# Patient Record
Sex: Male | Born: 1965 | Race: White | Hispanic: No | Marital: Married | Smoking: Former smoker
Health system: Southern US, Community
[De-identification: ages and names within clinical notes are randomized; demographics above are authoritative.]

## PROBLEM LIST (undated history)

## (undated) DIAGNOSIS — I10 Essential (primary) hypertension: Secondary | ICD-10-CM

## (undated) DIAGNOSIS — K222 Esophageal obstruction: Secondary | ICD-10-CM

## (undated) DIAGNOSIS — C140 Malignant neoplasm of pharynx, unspecified: Secondary | ICD-10-CM

## (undated) DIAGNOSIS — K219 Gastro-esophageal reflux disease without esophagitis: Secondary | ICD-10-CM

## (undated) DIAGNOSIS — E039 Hypothyroidism, unspecified: Secondary | ICD-10-CM

## (undated) HISTORY — DX: Hypothyroidism, unspecified: E03.9

## (undated) HISTORY — DX: Gastro-esophageal reflux disease without esophagitis: K21.9

## (undated) HISTORY — PX: TONSILLECTOMY: SUR1361

## (undated) HISTORY — DX: Esophageal obstruction: K22.2

## (undated) HISTORY — DX: Essential (primary) hypertension: I10

## (undated) HISTORY — DX: Malignant neoplasm of pharynx, unspecified: C14.0

---

## 2001-02-20 ENCOUNTER — Encounter: Admission: RE | Admit: 2001-02-20 | Discharge: 2001-02-20 | Payer: Self-pay | Admitting: Family Medicine

## 2001-02-20 ENCOUNTER — Encounter: Payer: Self-pay | Admitting: Family Medicine

## 2008-10-05 ENCOUNTER — Encounter: Admission: RE | Admit: 2008-10-05 | Discharge: 2008-10-05 | Payer: Self-pay | Admitting: Family Medicine

## 2010-10-17 ENCOUNTER — Encounter: Payer: Self-pay | Admitting: Cardiology

## 2010-10-18 ENCOUNTER — Ambulatory Visit (INDEPENDENT_AMBULATORY_CARE_PROVIDER_SITE_OTHER): Payer: 59 | Admitting: Cardiology

## 2010-10-18 ENCOUNTER — Encounter: Payer: Self-pay | Admitting: Cardiology

## 2010-10-18 ENCOUNTER — Encounter (INDEPENDENT_AMBULATORY_CARE_PROVIDER_SITE_OTHER): Payer: Self-pay | Admitting: *Deleted

## 2010-10-18 DIAGNOSIS — R072 Precordial pain: Secondary | ICD-10-CM | POA: Insufficient documentation

## 2010-10-18 DIAGNOSIS — I10 Essential (primary) hypertension: Secondary | ICD-10-CM | POA: Insufficient documentation

## 2010-10-18 DIAGNOSIS — E039 Hypothyroidism, unspecified: Secondary | ICD-10-CM | POA: Insufficient documentation

## 2010-10-18 DIAGNOSIS — R0989 Other specified symptoms and signs involving the circulatory and respiratory systems: Secondary | ICD-10-CM | POA: Insufficient documentation

## 2010-10-18 DIAGNOSIS — R209 Unspecified disturbances of skin sensation: Secondary | ICD-10-CM | POA: Insufficient documentation

## 2010-10-18 DIAGNOSIS — M25529 Pain in unspecified elbow: Secondary | ICD-10-CM | POA: Insufficient documentation

## 2010-10-25 NOTE — Miscellaneous (Signed)
  Clinical Lists Changes  Orders: Added new Referral order of Stress Echo (Stress Echo) - Signed

## 2010-10-25 NOTE — Assessment & Plan Note (Signed)
Summary: consult: left arm pain. per luz [phone].pt has uhc...   CC:  pt states he has been having numbness in arm he states this occurs when his heart rate goes up...he states he has had cancer in the past in his throat...pt complains of dizziness.  History of Present Illness: 45 year old male with no prior cardiac history for evaluation of chest pain. Patient typically does not have dyspnea on exertion, orthopnea, PND, pedal edema, palpitations, exertional chest pain or syncope. He recently has noticed a numb feeling in his left face, shoulder and left upper extremity when his heart rate is elevated. This can occur with activities. It does not occur with using his left upper extremity. There is no associated shortness of breath, diaphoresis or nausea. It improves as his heart rate decreases. He also has had transient dizzy spells for one to 2 seconds. There has been no frank syncope.  Current Medications (verified): 1)  Cozaar 50 Mg Tabs (Losartan Potassium) .... Take One Tablet By Mouth Daily 2)  Synthroid 100 Mcg Tabs (Levothyroxine Sodium) .Marland Kitchen.. 1 Tab By Mouth Once Daily 3)  Prilosec 20 Mg Cpdr (Omeprazole) .Marland Kitchen.. 1 Tab By Mouth Once Daily  Past History:  Past Medical History: HYPOTHYROIDISM  HYPERTENSION H/O throat cancer (s/p XRT and chemotherapy)  Past Surgical History: Previous lung trauma  Tonsillectomy  Family History: Reviewed history from 10/18/2010 and no changes required. No premature CAD in immediate family  GM: breast cancer  Social History: Tobacco Use - Former Full Time Married  Alcohol Use - yes  Review of Systems       no fevers or chills, productive cough, hemoptysis, dysphasia, odynophagia, melena, hematochezia, dysuria, hematuria, rash, seizure activity, orthopnea, PND, pedal edema, claudication. Remaining systems are negative.   Vital Signs:  Patient profile:   45 year old male Height:      72 inches Weight:      208 pounds BMI:      28.31 Pulse rate:   97 / minute Resp:     14 per minute BP sitting:   97 / 70  (left arm)  Vitals Entered By: Kem Parkinson (October 18, 2010 3:11 PM)  Physical Exam  General:  Well developed/well nourished in NAD Skin warm/dry Patient not depressed No peripheral clubbing Back-normal HEENT-normal/normal eyelids Neck supple/normal carotid upstroke bilaterally; no bruits; no JVD; no thyromegaly; Skin consistent with previous radiation. chest - CTA/ normal expansion CV - RRR/normal S1 and S2; no murmurs, rubs or gallops;  PMI nondisplaced Abdomen -NT/ND, no HSM, no mass, + bowel sounds, no bruit 2+ femoral pulses, no bruits Ext-no edema, chords, 2+ DP Neuro-grossly nonfocal      EKG  Procedure date:  10/17/2010  Findings:      Sinus rhythm at a rate of 87. Axis normal. No ST changes.  Impression & Recommendations:  Problem # 1:  CHEST PAIN, PRECORDIAL (ICD-786.51) Pt describes a discomfort/none consultation in his left arm and shoulder that is atypical. We'll schedule stress echocardiogram to further evaluate. I will also schedule Dopplers of his upper extremity to exclude stenosis. Orders: Echocardiogram (Echo)  Problem # 2:  HYPOTHYROIDISM (ICD-244.9)  His updated medication list for this problem includes:    Synthroid 100 Mcg Tabs (Levothyroxine sodium) .Marland Kitchen... 1 tab by mouth once daily  Problem # 3:  HYPERTENSION (ICD-401.9) Blood pressure controlled. Continue present medications. His updated medication list for this problem includes:    Cozaar 50 Mg Tabs (Losartan potassium) .Marland Kitchen... Take one tablet by mouth daily  Other Orders: Carotid Duplex (Carotid Duplex) Arterial Duplex Upper Extremity (Arterial Duplex Up )  Patient Instructions: 1)  Your physician has requested that you have a carotid duplex. This test is an ultrasound of the carotid arteries in your neck. It looks at blood flow through these arteries that supply the brain with blood. Allow one hour for  this exam. There are no restrictions or special instructions. 2)  Your physician has requested that you have a upper extremity arterial duplex.  This test is an ultrasound of the arteries in the legs or arms.  It looks at arterial blood flow in the legs and arms.  Allow one hour for Lower and Upper Arterial scans. There are no restrictions or special instructions. 3)  Your physician has requested that you have a stress echocardiogram. For further information please visit [email or web address]  Please follow instruction sheet as given.

## 2010-11-02 ENCOUNTER — Other Ambulatory Visit: Payer: 59

## 2010-11-02 ENCOUNTER — Other Ambulatory Visit (HOSPITAL_COMMUNITY): Payer: 59

## 2010-11-08 ENCOUNTER — Telehealth (INDEPENDENT_AMBULATORY_CARE_PROVIDER_SITE_OTHER): Payer: Self-pay | Admitting: *Deleted

## 2010-11-12 ENCOUNTER — Encounter: Payer: Self-pay | Admitting: Cardiology

## 2010-11-12 ENCOUNTER — Ambulatory Visit (HOSPITAL_COMMUNITY): Payer: BC Managed Care – PPO | Attending: Cardiology

## 2010-11-12 ENCOUNTER — Other Ambulatory Visit (HOSPITAL_COMMUNITY): Payer: 59

## 2010-11-12 ENCOUNTER — Encounter (INDEPENDENT_AMBULATORY_CARE_PROVIDER_SITE_OTHER): Payer: 59

## 2010-11-12 ENCOUNTER — Other Ambulatory Visit: Payer: 59

## 2010-11-12 DIAGNOSIS — R0989 Other specified symptoms and signs involving the circulatory and respiratory systems: Secondary | ICD-10-CM

## 2010-11-12 DIAGNOSIS — R55 Syncope and collapse: Secondary | ICD-10-CM

## 2010-11-12 DIAGNOSIS — I6529 Occlusion and stenosis of unspecified carotid artery: Secondary | ICD-10-CM

## 2010-11-12 DIAGNOSIS — E039 Hypothyroidism, unspecified: Secondary | ICD-10-CM | POA: Insufficient documentation

## 2010-11-12 DIAGNOSIS — I10 Essential (primary) hypertension: Secondary | ICD-10-CM | POA: Insufficient documentation

## 2010-11-12 DIAGNOSIS — R079 Chest pain, unspecified: Secondary | ICD-10-CM | POA: Insufficient documentation

## 2010-11-12 DIAGNOSIS — R072 Precordial pain: Secondary | ICD-10-CM

## 2010-11-14 ENCOUNTER — Other Ambulatory Visit: Payer: 59

## 2010-11-15 ENCOUNTER — Telehealth: Payer: Self-pay | Admitting: Cardiology

## 2010-11-15 NOTE — Progress Notes (Signed)
Summary: Stress echo appt  Phone Note Outgoing Call Call back at [facility] Phone [phone]   Call placed by: Stanton Kidney, EMT-P,  November 08, 2010 2:06 PM Summary of Call: Unable to leave message reference appts. on 11/12/10. Stanton Kidney, EMT-P  November 08, 2010 2:07 PM

## 2010-11-20 NOTE — Progress Notes (Signed)
Summary: pt calling re results of stress test-   Phone Note Call from Patient   Caller: Patient [phone] Reason for Call: Talk to Nurse, Lab or Test Results Summary of Call: pt calling re results of stress test  Initial call taken by: Glynda Jaeger,  November 15, 2010 10:29 AM  Follow-up for Phone Call        pt aware* Whitney Maeola Sarah RN  November 15, 2010 11:30 AM  Follow-up by: Whitney Maeola Sarah RN,  November 15, 2010 11:30 AM

## 2010-11-20 NOTE — Letter (Signed)
Summary: [facility] Referral   Imported By: Kassie Mends 11/13/2010 11:49:02  _____________________________________________________________________  External Attachment:    Type:   Image     Comment:   External Document

## 2010-11-21 ENCOUNTER — Emergency Department (HOSPITAL_COMMUNITY)
Admission: EM | Admit: 2010-11-21 | Discharge: 2010-11-21 | Disposition: A | Discharge status: Home / Self Care | Payer: BC Managed Care – PPO | Attending: Emergency Medicine | Admitting: Emergency Medicine

## 2010-11-21 DIAGNOSIS — E039 Hypothyroidism, unspecified: Secondary | ICD-10-CM | POA: Insufficient documentation

## 2010-11-21 DIAGNOSIS — M542 Cervicalgia: Secondary | ICD-10-CM | POA: Insufficient documentation

## 2010-11-21 DIAGNOSIS — I10 Essential (primary) hypertension: Secondary | ICD-10-CM | POA: Insufficient documentation

## 2012-06-19 ENCOUNTER — Other Ambulatory Visit: Payer: Self-pay | Admitting: Family Medicine

## 2012-06-19 DIAGNOSIS — M542 Cervicalgia: Secondary | ICD-10-CM

## 2012-06-30 ENCOUNTER — Ambulatory Visit
Admission: RE | Admit: 2012-06-30 | Discharge: 2012-06-30 | Disposition: A | Discharge status: Home / Self Care | Payer: 59 | Source: Ambulatory Visit | Attending: Family Medicine | Admitting: Family Medicine

## 2012-06-30 DIAGNOSIS — M542 Cervicalgia: Secondary | ICD-10-CM

## 2012-12-10 ENCOUNTER — Other Ambulatory Visit: Payer: Self-pay | Admitting: Family Medicine

## 2012-12-10 DIAGNOSIS — R202 Paresthesia of skin: Secondary | ICD-10-CM

## 2012-12-16 ENCOUNTER — Ambulatory Visit
Admission: RE | Admit: 2012-12-16 | Discharge: 2012-12-16 | Disposition: A | Discharge status: Home / Self Care | Payer: 59 | Source: Ambulatory Visit | Attending: Family Medicine | Admitting: Family Medicine

## 2012-12-16 ENCOUNTER — Encounter: Payer: Self-pay | Admitting: Family Medicine

## 2012-12-16 ENCOUNTER — Encounter (INDEPENDENT_AMBULATORY_CARE_PROVIDER_SITE_OTHER): Payer: 59 | Admitting: *Deleted

## 2012-12-16 DIAGNOSIS — R202 Paresthesia of skin: Secondary | ICD-10-CM

## 2012-12-16 DIAGNOSIS — R209 Unspecified disturbances of skin sensation: Secondary | ICD-10-CM

## 2013-05-14 ENCOUNTER — Encounter: Payer: Self-pay | Admitting: Neurology

## 2013-05-17 ENCOUNTER — Ambulatory Visit: Payer: 59 | Admitting: Neurology

## 2014-03-08 ENCOUNTER — Ambulatory Visit: Payer: 59 | Admitting: Cardiovascular Disease

## 2014-03-22 ENCOUNTER — Encounter: Payer: Self-pay | Admitting: *Deleted

## 2014-03-22 ENCOUNTER — Encounter: Payer: Self-pay | Admitting: Cardiology

## 2014-03-22 ENCOUNTER — Ambulatory Visit (INDEPENDENT_AMBULATORY_CARE_PROVIDER_SITE_OTHER): Payer: BC Managed Care – HMO | Admitting: Cardiology

## 2014-03-22 VITALS — BP 108/78 | HR 83 | Ht 72.0 in | Wt 200.0 lb

## 2014-03-22 DIAGNOSIS — R209 Unspecified disturbances of skin sensation: Secondary | ICD-10-CM

## 2014-03-22 DIAGNOSIS — R200 Anesthesia of skin: Secondary | ICD-10-CM

## 2014-03-22 DIAGNOSIS — R011 Cardiac murmur, unspecified: Secondary | ICD-10-CM | POA: Insufficient documentation

## 2014-03-22 NOTE — Progress Notes (Signed)
     HPI: 48 yo male for evaluation of murmur and LUE numbness. Seen for evaluation of chest pain 3/12. Carotid dopplers 3/12 showed 1-39% bilateral stenosis. Stress echo 3/12 normal. Upper ext arterial dopplers 4/14 showed normal perfusion. Patient denies dyspnea on exertion, orthopnea, PND, pedal edema, chest pain or syncope. For 3 years he has had numbness in his left upper extremity. This occurs when his heart rate is elevated with vigorous activities. He also notices this with more vigorous activities using his left upper extremity. More recently he can reproduce the symptoms with applying pressure to the left side of his neck. He states his arm feels dead and numb. Symptoms resolve with decreased heart rate. He has seen neurosurgery for evaluation with no significant abnormalities noted by his report.  Current Outpatient Prescriptions  Medication Sig Dispense Refill  . levothyroxine (SYNTHROID) 150 MCG tablet Take 150 mcg by mouth daily before breakfast.      . losartan (COZAAR) 50 MG tablet Take 1 tablet by mouth daily.      Marland Kitchen omeprazole (PRILOSEC) 20 MG capsule Take 20 mg by mouth daily.       No current facility-administered medications for this visit.    No Known Allergies  Past Medical History  Diagnosis Date  . Hypothyroidism   . GERD (gastroesophageal reflux disease)   . Hypertension   . Throat cancer     Radiation  . Esophageal stricture     Past Surgical History  Procedure Laterality Date  . Tonsillectomy      History   Social History  . Marital Status: Married    Spouse Name: N/A    Number of Children: 4  . Years of Education: N/A   Occupational History  .      Architectural technologist   Social History Main Topics  . Smoking status: Never Smoker   . Smokeless tobacco: Not on file  . Alcohol Use: Yes     Comment: Rare  . Drug Use: Not on file  . Sexual Activity: Not on file   Other Topics Concern  . Not on file   Social History Narrative  . No  narrative on file    Family History  Problem Relation Age of Onset  . Heart disease Maternal Grandfather   . Heart disease Maternal Grandmother     ROS: no fevers or chills, productive cough, hemoptysis, dysphasia, odynophagia, melena, hematochezia, dysuria, hematuria, rash, seizure activity, orthopnea, PND, pedal edema, claudication. Remaining systems are negative.  Physical Exam:   Blood pressure 108/78, pulse 83, height 6' (1.829 m), weight 200 lb (90.719 kg).  Blood pressure left upper extremity 138/86. Right upper extremity 140/86.  General:  Well developed/well nourished in NAD Skin warm/dry Patient not depressed No peripheral clubbing Back-normal HEENT-normal/normal eyelids Neck supple/normal carotid upstroke bilaterally; no bruits; no JVD; no thyromegaly chest - CTA/ normal expansion CV - RRR/normal S1 and S2; no rubs or gallops;  PMI nondisplaced; 2/6 systolic murmur lower left sternal border. Abdomen -NT/ND, no HSM, no mass, + bowel sounds, no bruit 2+ femoral pulses, no bruits Ext-no edema, chords, 2+ DP Neuro-grossly nonfocal  ECG Sinus rhythm at a rate of 83. No ST changes.

## 2014-03-22 NOTE — Assessment & Plan Note (Signed)
Etiology unclear. Apparently previous neurosurgical evaluation unremarkable. Previous arterial Dopplers negative. Previous stress echocardiogram unremarkable. Blood pressure equal in both arms. He did apply pressure to his neck during the office visit which reproduces his symptoms. The left radial pulse at that time was normal. Etiology of symptoms not clear to me. Question vascular insufficiency. This seems less likely but we'll proceed with CTA of aortic arch and left subclavian to further assess. Note his symptoms occur with vigorous use of his left upper extremity or when his heart rate is elevated.

## 2014-03-22 NOTE — Patient Instructions (Signed)
Your physician recommends that you schedule a follow-up appointment in: [place] physician has requested that you have an echocardiogram. Echocardiography is a painless test that uses sound waves to create images of your heart. It provides your doctor with information about the size and shape of your heart and how well your heart's chambers and valves are working. This procedure takes approximately one hour. There are no restrictions for this procedure.   CTA OF THE CHEST WITH AND W/O CONTRAST TO LOOK AT AORTIC ARCH DX NUMBNESS AT [place]  CTA OF THE LEFT UPPER EXT AND SUBCLAVIN FOR NUMBNESS AT [place]

## 2014-03-22 NOTE — Assessment & Plan Note (Signed)
Blood pressure controlled. Continue present medications. 

## 2014-03-22 NOTE — Assessment & Plan Note (Signed)
Schedule echocardiogram to further assess. 

## 2014-03-28 ENCOUNTER — Ambulatory Visit (HOSPITAL_COMMUNITY): Payer: BC Managed Care – PPO

## 2014-03-28 ENCOUNTER — Ambulatory Visit (HOSPITAL_COMMUNITY): Admission: RE | Admit: 2014-03-28 | Payer: BC Managed Care – PPO | Source: Ambulatory Visit

## 2014-03-30 ENCOUNTER — Ambulatory Visit (HOSPITAL_COMMUNITY): Payer: BC Managed Care – HMO

## 2014-05-20 ENCOUNTER — Ambulatory Visit (HOSPITAL_COMMUNITY)
Admission: RE | Admit: 2014-05-20 | Discharge: 2014-05-20 | Disposition: A | Discharge status: Home / Self Care | Payer: BC Managed Care – PPO | Source: Ambulatory Visit | Attending: Cardiology | Admitting: Cardiology

## 2014-05-20 DIAGNOSIS — R209 Unspecified disturbances of skin sensation: Secondary | ICD-10-CM | POA: Diagnosis not present

## 2014-05-20 DIAGNOSIS — R200 Anesthesia of skin: Secondary | ICD-10-CM

## 2014-05-20 MED ORDER — IOHEXOL 350 MG/ML SOLN
100.0000 mL | Freq: Once | INTRAVENOUS | Status: AC | PRN
  Administered 2014-05-20: 100 mL via INTRAVENOUS

## 2014-05-24 ENCOUNTER — Other Ambulatory Visit (HOSPITAL_COMMUNITY): Payer: Self-pay | Admitting: Cardiology

## 2014-05-24 DIAGNOSIS — R011 Cardiac murmur, unspecified: Secondary | ICD-10-CM

## 2014-05-30 ENCOUNTER — Ambulatory Visit (HOSPITAL_COMMUNITY)
Admission: RE | Admit: 2014-05-30 | Discharge: 2014-05-30 | Disposition: A | Discharge status: Home / Self Care | Payer: BC Managed Care – PPO | Source: Ambulatory Visit | Attending: Cardiology | Admitting: Cardiology

## 2014-05-30 DIAGNOSIS — R011 Cardiac murmur, unspecified: Secondary | ICD-10-CM | POA: Insufficient documentation

## 2014-05-30 DIAGNOSIS — I10 Essential (primary) hypertension: Secondary | ICD-10-CM | POA: Insufficient documentation

## 2014-05-30 DIAGNOSIS — R209 Unspecified disturbances of skin sensation: Secondary | ICD-10-CM | POA: Insufficient documentation

## 2014-05-30 DIAGNOSIS — E039 Hypothyroidism, unspecified: Secondary | ICD-10-CM | POA: Diagnosis not present

## 2014-05-30 NOTE — Progress Notes (Signed)
2D Echo Performed 05/30/2014    Tammie Crouch, RCS  

## 2014-06-06 ENCOUNTER — Encounter: Payer: Self-pay | Admitting: Cardiology

## 2014-06-09 ENCOUNTER — Encounter: Payer: Self-pay | Admitting: *Deleted

## 2014-06-14 ENCOUNTER — Telehealth: Payer: Self-pay | Admitting: Cardiology

## 2014-06-14 NOTE — Telephone Encounter (Signed)
Pt would like ultrasound results from about 2 weeks ago please.

## 2014-06-14 NOTE — Telephone Encounter (Signed)
Attempted to contact - no answer - VM box full

## 2014-06-14 NOTE — Telephone Encounter (Signed)
Spoke with patient and provided echo results. Informed him that echo results were mailed to him. Patient states his left arm is still going numb and echo and CT of upper extremity were OK.   Order upper arterial doppler study?   Patient would like to know what is next best step

## 2014-06-15 NOTE — Telephone Encounter (Signed)
CTA does not show significant left subclavian disease; if he wants, could see Dr Fletcher Anon to see if anything else would be helpful. Troy Hull

## 2014-06-15 NOTE — Telephone Encounter (Signed)
Spoke with pt, Aware of dr crenshaw's recommendations.  Follow up scheduled  

## 2014-07-26 ENCOUNTER — Encounter: Payer: BC Managed Care – PPO | Admitting: Cardiovascular Disease

## 2014-08-01 NOTE — Progress Notes (Signed)
This encounter was created in error - please disregard.

## 2015-11-09 ENCOUNTER — Other Ambulatory Visit: Payer: Self-pay | Admitting: Orthopaedic Surgery

## 2015-11-09 DIAGNOSIS — M25512 Pain in left shoulder: Secondary | ICD-10-CM

## 2015-11-21 ENCOUNTER — Ambulatory Visit
Admission: RE | Admit: 2015-11-21 | Discharge: 2015-11-21 | Disposition: A | Discharge status: Home / Self Care | Payer: 59 | Source: Ambulatory Visit | Attending: Orthopaedic Surgery | Admitting: Orthopaedic Surgery

## 2015-11-21 DIAGNOSIS — M25512 Pain in left shoulder: Secondary | ICD-10-CM

## 2015-11-21 MED ORDER — GADOBENATE DIMEGLUMINE 529 MG/ML IV SOLN
19.0000 mL | Freq: Once | INTRAVENOUS | Status: AC | PRN
  Administered 2015-11-21: 19 mL via INTRAVENOUS

## 2016-12-27 IMAGING — MR MR SHOULDER*L* WO/W CM
4 of 8 series · 17 of 40 positions shown · IV contrast (multihance)
Comparison: None.

CLINICAL DATA: Left scapular and shoulder pain. Limited range of
motion for 1 year.

EXAM:
MRI OF THE LEFT SHOULDER WITHOUT AND WITH CONTRAST
TECHNIQUE: Multiplanar, multisequence MR imaging of the left shoulder was
performed before and after the administration of intravenous
contrast.
CONTRAST:  19mL MULTIHANCE GADOBENATE DIMEGLUMINE 529 MG/ML IV SOLN

[Series 6: T2 fat-sat · oblique · left · 3.0mm · 0.44mm/px · 4 of 21 slices shown (1 of 3)]
[im 1/21]
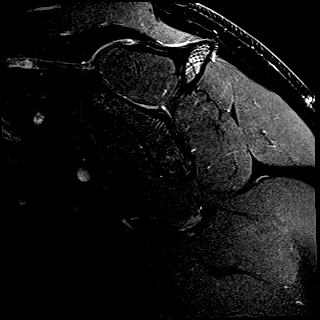
[im 7/21]
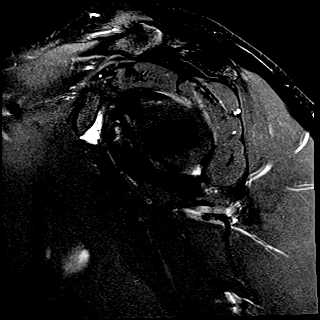
[im 14/21]
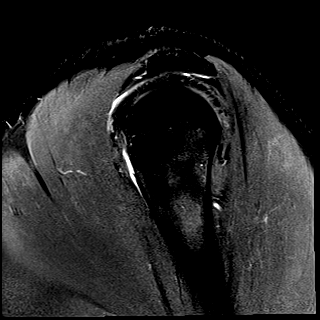
[im 21/21]
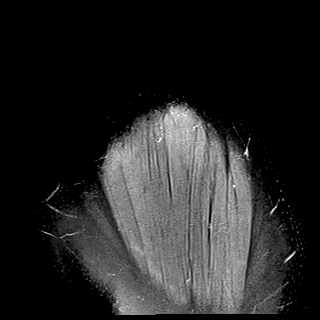

[Series 8: T2 fat-sat · axial · left · 3.0mm · 0.44mm/px · z∈[-46,+42]mm · 5 of 25 slices shown (2 of 3)]
[im 1/25]
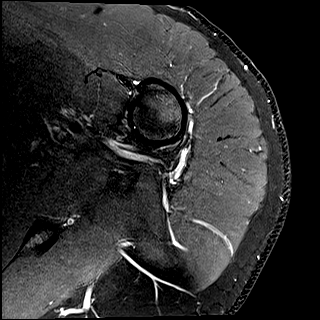
[im 7/25]
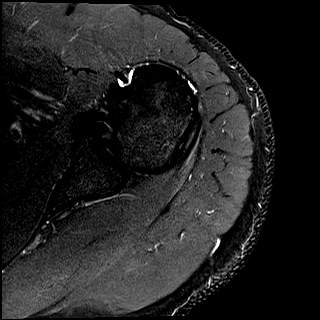
[im 13/25]
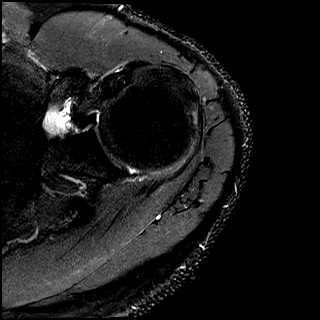
[im 19/25]
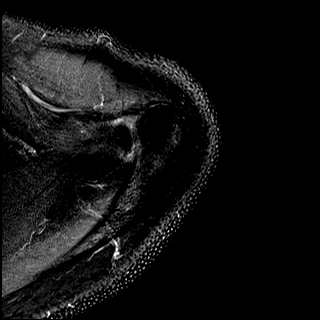
[im 25/25]
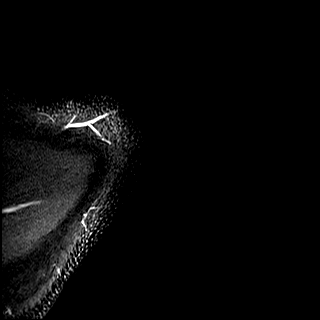

[Series 9: PD · oblique · left · 3.0mm · 0.18mm/px · 5 of 21 slices shown]
[im 1/21]
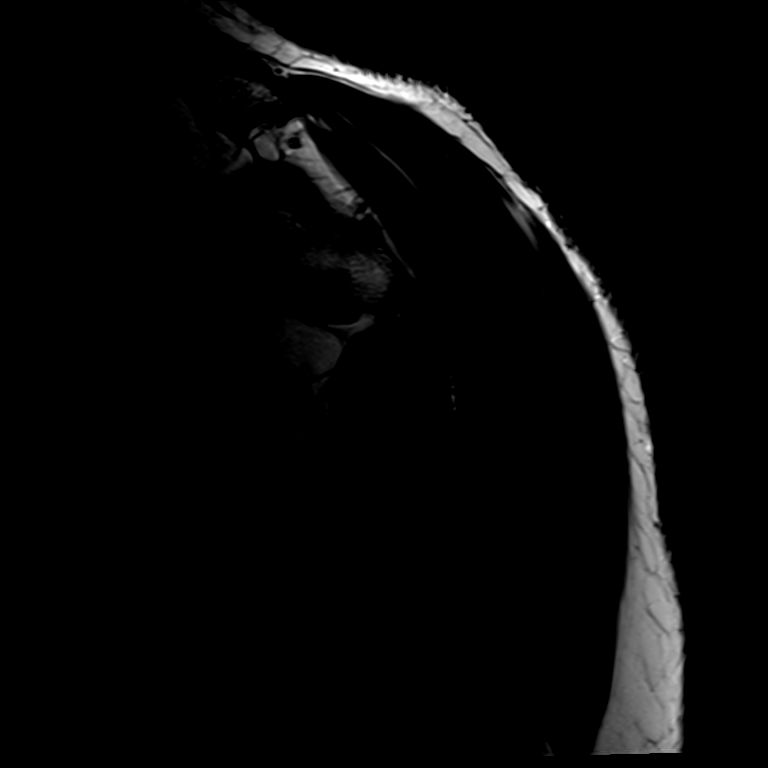
[im 6/21]
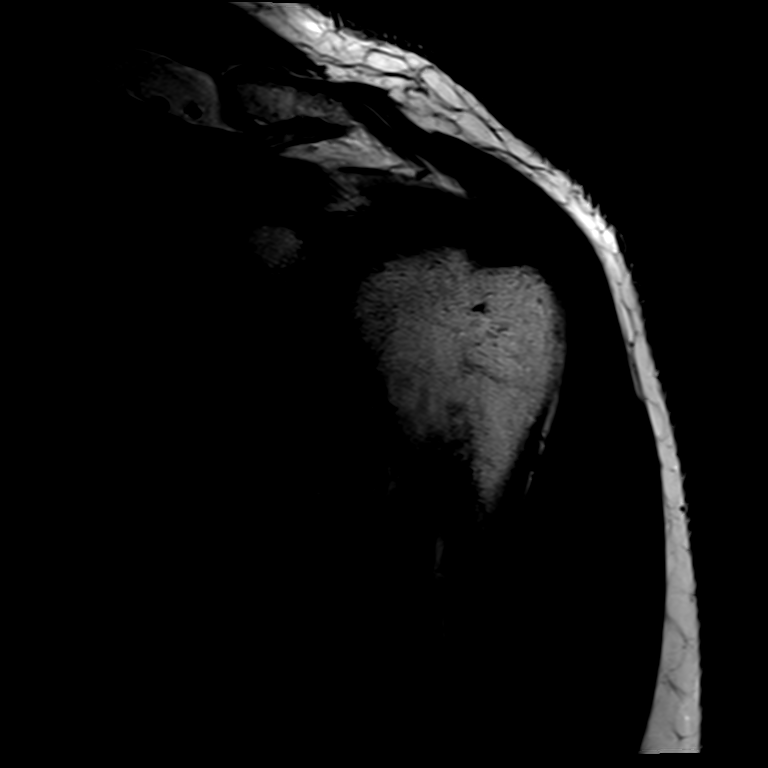
[im 11/21]
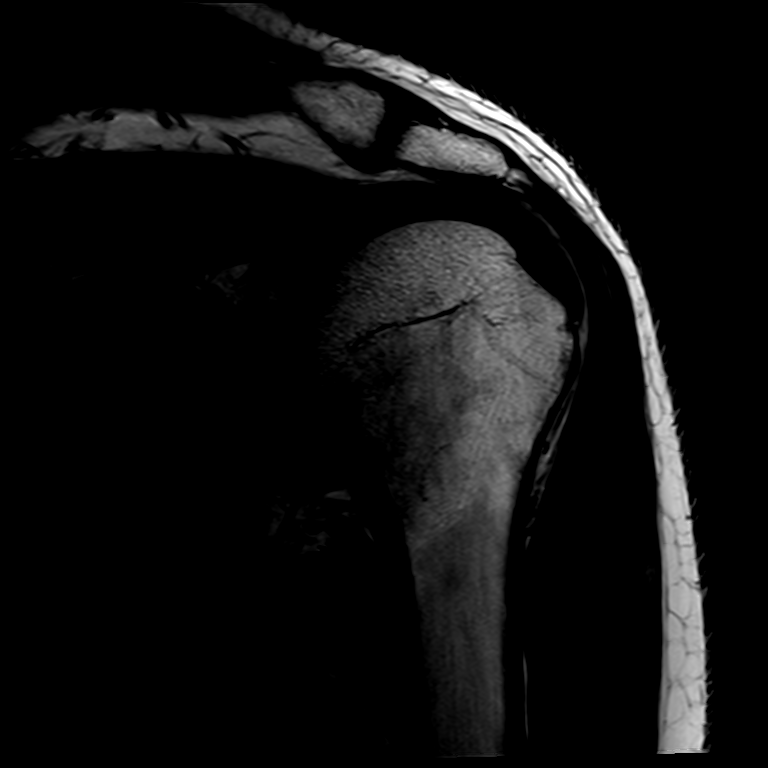
[im 16/21]
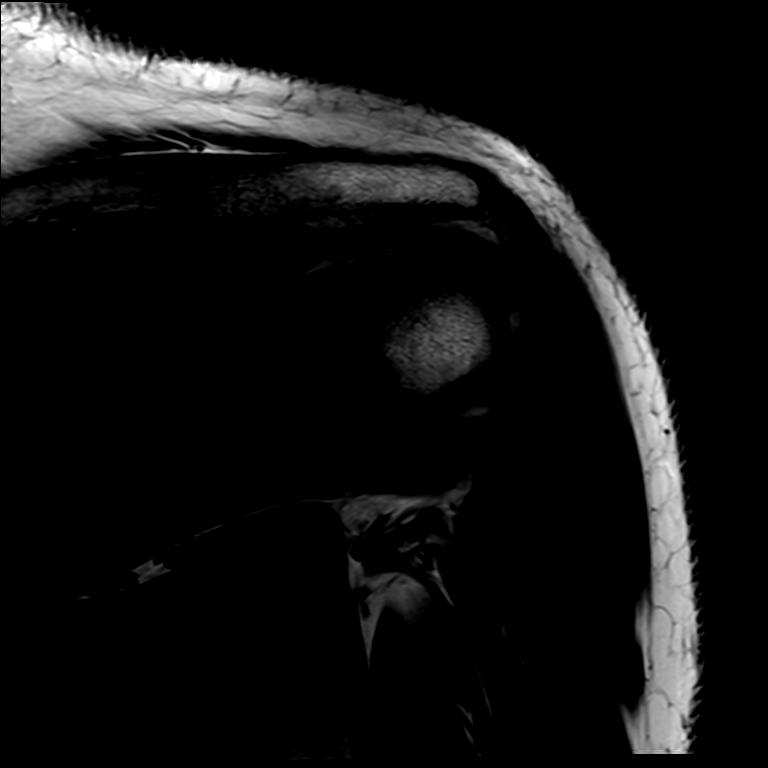
[im 21/21]
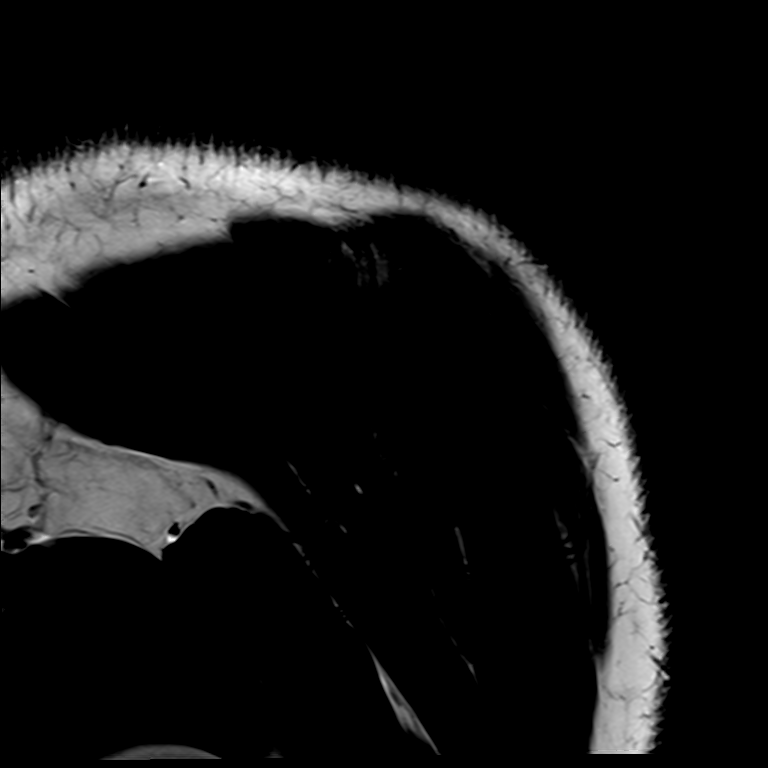

[Series 10: T2 fat-sat · oblique · left · 3.0mm · 0.22mm/px · 3 of 21 slices shown (3 of 3)]
[im 1/21]
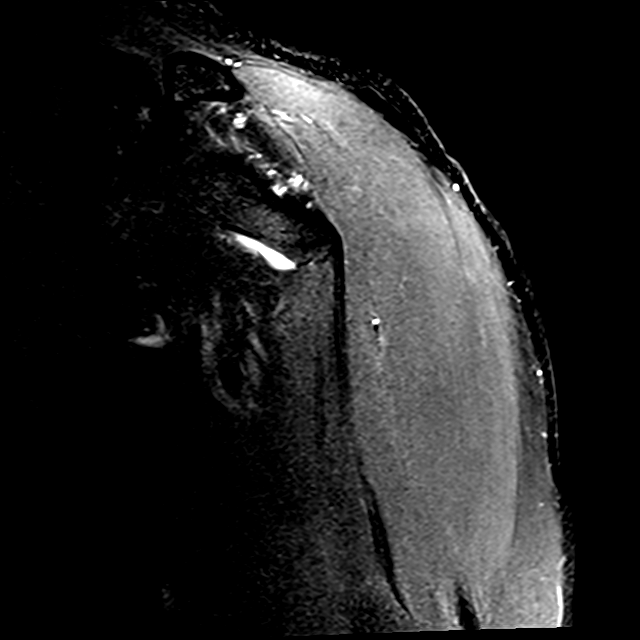
[im 11/21]
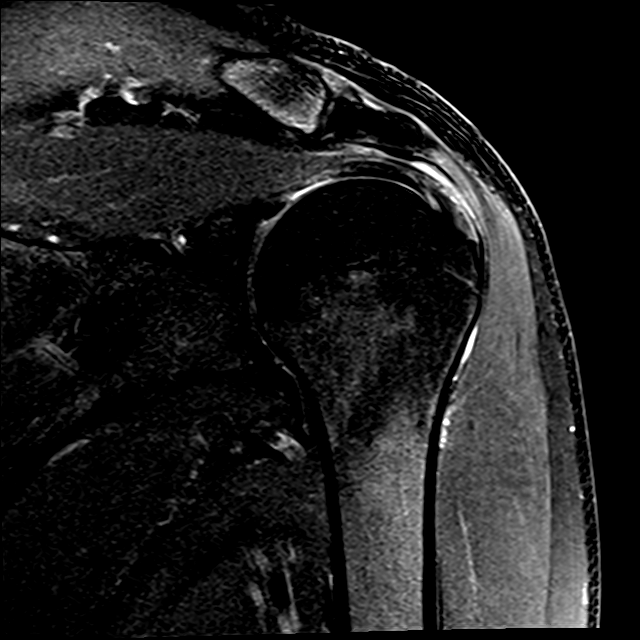
[im 21/21]
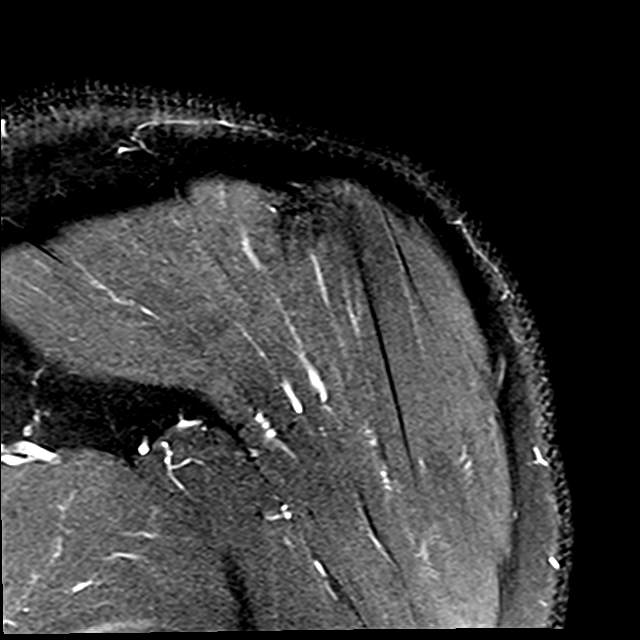

[17 of 40 positions shown; findings below may reference images not displayed]

FINDINGS: Rotator cuff: Mild tendinosis of the supraspinatus tendon without a
discrete tear. Infraspinatus tendon is intact. Teres minor tendon is
intact. Subscapularis tendon is intact.

Muscles: No atrophy or fatty replacement of nor abnormal signal
within, the muscles of the rotator cuff.

Biceps long head:  Intact.

Acromioclavicular Joint: Mild degenerative changes of the
acromioclavicular joint. Type II acromion. Small amount of
subacromial/ subdeltoid bursal fluid.

Glenohumeral Joint: No joint effusion.  No chondral defect.

Labrum: Grossly intact, but evaluation is limited by lack of
intraarticular fluid.

Bones: Mild T2 hyperintense signal in the body of the scapula
medially with enhancement on postcontrast imaging without cortical
destruction or periosteal reaction. No acute fracture or
dislocation.

Soft tissue: No soft tissue mass.  No fluid collection or hematoma.
IMPRESSION: 1. Mild T2 hyperintense signal in the body of the scapula medially
with enhancement on postcontrast imaging without cortical
destruction or periosteal reaction. This may reflect an area of
contusion, but no discrete bone lesion is identified. Given that the
area is nonspecific, a follow-up MRI of the scapula is recommended
in 3 months. Mild tendinosis of the supraspinatus tendon without a
discrete tear.

## 2017-11-19 ENCOUNTER — Ambulatory Visit: Payer: 59 | Admitting: Cardiology

## 2017-11-19 ENCOUNTER — Encounter: Payer: Self-pay | Admitting: Cardiology

## 2017-11-19 VITALS — BP 161/94 | HR 70 | Ht 72.0 in | Wt 206.0 lb

## 2017-11-19 DIAGNOSIS — I10 Essential (primary) hypertension: Secondary | ICD-10-CM | POA: Diagnosis not present

## 2017-11-19 MED ORDER — LOSARTAN POTASSIUM-HCTZ 100-12.5 MG PO TABS: 1.0000 | ORAL_TABLET | Freq: Every day | ORAL | 9 refills | Status: DC

## 2017-11-19 NOTE — Progress Notes (Signed)
Referring-Self Reason for referral-Hypertension  HPI: 52 year old male for evaluation of hypertension; self referral.  Patient seen previously but not since July 2015.  Carotid Dopplers March 2012 showed 1-39% bilateral stenosis.  Stress echocardiogram March 2012 was normal.  Upper ext arterial Dopplers April 2014 normal.  Echocardiogram September 2015 showed normal LV function.  CTA September 2015 showed no acute vascular abnormalities in upper extremities.  There was nonspecific narrowing of the left subclavian.  Patient does have a history of hypertension.  However recently his blood pressure has been more elevated.  He therefore presented for further evaluation.  He denies dyspnea, chest pain, palpitations or syncope.  Current Outpatient Medications  Medication Sig Dispense Refill  . levothyroxine (SYNTHROID) 150 MCG tablet Take 150 mcg by mouth daily before breakfast.    . losartan (COZAAR) 50 MG tablet Take 1 tablet by mouth daily.    Marland Kitchen omeprazole (PRILOSEC) 40 MG capsule   1   No current facility-administered medications for this visit.     No Known Allergies   Past Medical History:  Diagnosis Date  . Esophageal stricture   . GERD (gastroesophageal reflux disease)   . Hypertension   . Hypothyroidism   . Throat cancer [facility])    Radiation    Past Surgical History:  Procedure Laterality Date  . TONSILLECTOMY      Social History   Socioeconomic History  . Marital status: Married    Spouse name: Not on file  . Number of children: 4  . Years of education: Not on file  . Highest education level: Not on file  Social Needs  . Financial resource strain: Not on file  . Food insecurity - worry: Not on file  . Food insecurity - inability: Not on file  . Transportation needs - medical: Not on file  . Transportation needs - non-medical: Not on file  Occupational History    Employer: [employer]    Comment: Architectural technologist  Tobacco Use  .  Smoking status: Former Research scientist (life sciences)  . Smokeless tobacco: Never Used  Substance and Sexual Activity  . Alcohol use: Yes    Comment: Rare  . Drug use: Not on file  . Sexual activity: Not on file  Other Topics Concern  . Not on file  Social History Narrative  . Not on file    Family History  Problem Relation Age of Onset  . Heart disease Maternal Grandfather   . Heart disease Maternal Grandmother     ROS: no fevers or chills, productive cough, hemoptysis, dysphasia, odynophagia, melena, hematochezia, dysuria, hematuria, rash, seizure activity, orthopnea, PND, pedal edema, claudication. Remaining systems are negative.  Physical Exam:   Blood pressure (!) 161/94, pulse 70, height 6' (1.829 m), weight 206 lb (93.4 kg).  General:  Well developed/well nourished in NAD Skin warm/dry Patient not depressed No peripheral clubbing Back-normal HEENT-normal/normal eyelids Neck supple/normal carotid upstroke bilaterally; no bruits; no JVD; no thyromegaly chest - CTA/ normal expansion CV - RRR/normal S1 and S2; no rubs or gallops;  PMI nondisplaced, 2/6 systolic murmur left sternal border. Abdomen -NT/ND, no HSM, no mass, + bowel sounds, no bruit 2+ femoral pulses, no bruits Ext-no edema, chords, 2+ DP Neuro-grossly nonfocal  ECG -normal sinus rhythm, no ST changes.  Personally reviewed  A/P  1 hypertension-blood pressure is elevated today.  I will change his Cozaar to Hyzaar 100/12.5 mg daily.  Check potassium and renal function in 1 week.  Follow blood pressure at home and  adjust regimen as needed.  We discussed lifestyle modification including diet and exercise and low-sodium diet.  He does not smoke or consume alcohol to excess.  2 gastroesophageal reflux disease-management per primary care.  Kirk Ruths, MD

## 2017-11-19 NOTE — Patient Instructions (Signed)
Medication Instructions:  Your physician has recommended you make the following change in your medication:  1. Stop Cozzar and start Hyzzar (100 - 12.5 mg ) daily  Labwork: Your physician recommends that you return for lab work in: one week, paper work for labs given today.   Testing/Procedures: -None  Follow-Up: Your physician recommends that you keep your scheduled  follow-up appointment in 3 months.    Any Other Special Instructions Will Be Listed Below (If Applicable).     If you need a refill on your cardiac medications before your next appointment, please call your pharmacy.

## 2017-11-25 ENCOUNTER — Other Ambulatory Visit: Payer: Self-pay | Admitting: Cardiology

## 2017-11-25 ENCOUNTER — Telehealth: Payer: Self-pay | Admitting: Cardiology

## 2017-11-25 DIAGNOSIS — I10 Essential (primary) hypertension: Secondary | ICD-10-CM

## 2017-11-25 MED ORDER — LOSARTAN POTASSIUM 50 MG PO TABS: 50.0000 mg | ORAL_TABLET | Freq: Every day | ORAL | 6 refills | Status: DC

## 2017-11-25 MED ORDER — HYDROCHLOROTHIAZIDE 12.5 MG PO CAPS: 12.5000 mg | ORAL_CAPSULE | Freq: Every day | ORAL | 6 refills | Status: DC

## 2017-11-25 NOTE — Telephone Encounter (Signed)
Change to cozaar 50 mg daily and hctz 12.5 mg daily; bmet one week and follow BP Kirk Ruths

## 2017-11-25 NOTE — Telephone Encounter (Signed)
Returned call to patient.He stated since he started taking Losartan/HCTZ 100/12.5 mg daily his B/P has been 90/60,96/66, does not check pulse.He feels awful,no energy.Advised I will send message to [facility] for advice.

## 2017-11-25 NOTE — Telephone Encounter (Signed)
New Message  Pt c/o medication issue:  1. Name of Medication: losartan-hydrochlorothiazide (HYZAAR) 100-12.5 MG tablet    2. How are you currently taking this medication (dosage and times per day)? Take 1 tablet by mouth daily.  3. Are you having a reaction (difficulty breathing--STAT)? no  4. What is your medication issue? Pt states that when he takes his medication his bp drops down to 60/90, Please call

## 2017-11-25 NOTE — Telephone Encounter (Signed)
Returned call to patient Dr.Crenshaw's recommendations given.Advised to have bmet and B/P check in 1 week.Patient will have done at [facility] office.

## 2017-11-26 LAB — BASIC METABOLIC PANEL
BUN/Creatinine Ratio: 15 (ref 9–20)
BUN: 16 mg/dL (ref 6–24)
CO2: 26 mmol/L (ref 20–29)
Calcium: 9.5 mg/dL (ref 8.7–10.2)
Chloride: 100 mmol/L (ref 96–106)
Creatinine, Ser: 1.09 mg/dL (ref 0.76–1.27)
GFR calc Af Amer: 88 mL/min/{1.73_m2} (ref >59)
GFR calc non Af Amer: 77 mL/min/{1.73_m2} (ref >59)
Glucose: 101 mg/dL — ABNORMAL HIGH (ref 65–99)
Potassium: 4.7 mmol/L (ref 3.5–5.2)
Sodium: 138 mmol/L (ref 134–144)

## 2017-12-01 ENCOUNTER — Encounter: Payer: Self-pay | Admitting: *Deleted

## 2017-12-01 ENCOUNTER — Other Ambulatory Visit: Payer: Self-pay | Admitting: *Deleted

## 2017-12-01 MED ORDER — LOSARTAN POTASSIUM-HCTZ 50-12.5 MG PO TABS: 1.0000 | ORAL_TABLET | Freq: Every day | ORAL | 3 refills | Status: DC

## 2017-12-03 ENCOUNTER — Other Ambulatory Visit: Payer: Self-pay

## 2017-12-03 ENCOUNTER — Ambulatory Visit (INDEPENDENT_AMBULATORY_CARE_PROVIDER_SITE_OTHER): Payer: 59 | Admitting: Cardiology

## 2017-12-03 VITALS — BP 120/88

## 2017-12-03 DIAGNOSIS — I10 Essential (primary) hypertension: Secondary | ICD-10-CM

## 2017-12-03 MED ORDER — LOSARTAN POTASSIUM-HCTZ 50-12.5 MG PO TABS: 1.0000 | ORAL_TABLET | Freq: Every day | ORAL | 11 refills | Status: DC

## 2017-12-03 NOTE — Progress Notes (Signed)
Patient arrived for a BP check and BMP after changing from losartan 100 mg/hydrochlorothiazide 12.5 mg daily to losartan 50 mg/hydrochlorothiazide 12.5 mg daily. Patient states that since reducing his dose he is feeling much better and his blood pressure has normalized to 120s-130s/80s at home. Blood pressure in office today 122/88.   Reviewed with Dr. Stanford Breed. Advised for patient to continue current dosing.   Advised for patient to continue current dosing. Patient requested 30 day supply of combination pill be sent to [facility] in [place] instead of a 90 day supply. Refill sent. Patient taken to lab for BMP. Advised patient he would receive a phone call with results once reviewed by Dr. Stanford Breed. Patient verbalized understanding, no further questions.

## 2017-12-04 LAB — BASIC METABOLIC PANEL
BUN/Creatinine Ratio: 14 (ref 9–20)
BUN: 14 mg/dL (ref 6–24)
CO2: 24 mmol/L (ref 20–29)
Calcium: 9.6 mg/dL (ref 8.7–10.2)
Chloride: 103 mmol/L (ref 96–106)
Creatinine, Ser: 0.99 mg/dL (ref 0.76–1.27)
GFR calc Af Amer: 99 mL/min/{1.73_m2} (ref >59)
GFR calc non Af Amer: 86 mL/min/{1.73_m2} (ref >59)
Glucose: 92 mg/dL (ref 65–99)
Potassium: 4.3 mmol/L (ref 3.5–5.2)
Sodium: 140 mmol/L (ref 134–144)

## 2017-12-05 ENCOUNTER — Encounter: Payer: Self-pay | Admitting: *Deleted

## 2017-12-15 ENCOUNTER — Telehealth: Payer: Self-pay | Admitting: Cardiology

## 2017-12-15 NOTE — Telephone Encounter (Signed)
Left message for patient wife of dr Jacalyn Lefevre recommendations.

## 2017-12-15 NOTE — Telephone Encounter (Signed)
Spoke with wife and reviewed Dr Jacalyn Lefevre recommendations, see previous phone note.  Did speak with patient and he stated he does have days where his blood pressure will go way up and stay there. Offered appointment with Pharm D in blood pressure clinic, patient declined and will continue to monitor and log. He will make sure he is aware of what his food intake is prior to days where blood pressure is elevated. He will call back if any further concerns.

## 2017-12-15 NOTE — Telephone Encounter (Signed)
Hold BP meds until N/V improve and BP improves; can resume at that time if needed Kirk Ruths

## 2017-12-15 NOTE — Telephone Encounter (Signed)
New message  Pt wife verbalized that she is calling for RN  With the pt with her she stated that she was told that   She is not on the pt DPR  Please call back

## 2017-12-15 NOTE — Telephone Encounter (Signed)
Follow Up:    Returning Debra's call,she says she needs to know what to do about pt's blood pressure.

## 2017-12-15 NOTE — Telephone Encounter (Signed)
New message:    Pt c/o BP issue: STAT if pt c/o blurred vision, one-sided weakness or slurred speech  1. What are your last 5 BP readings? 89/55-last night 114/64 100/64  2. Are you having any other symptoms (ex. Dizziness, headache, blurred vision, passed out)? Blurred vision, bending over makes him dizzy  3. What is your BP issue? Pt states he has no energy at all. Pt is having to breathe in and out of a bag to catch his breath. Pt has been vomiting all night.

## 2017-12-15 NOTE — Telephone Encounter (Signed)
Incoming call from the patient's wife. The patient was unable to speak due to nausea. The wife stated that the patient's blood pressure has been low last week but the patient has been having severe nausea and vomiting. He had throat cancer 15 years ago and since then has episodes where certain foods make him aspirate and have nausea and vomiting.   His blood pressure (no heart rate was recorded) has been: 114/64 Saturday 89/55 yesterday 100/60 today  He has not taken his losartan-hydrochlorothiazide all weekend. The wife would like to know what to do to bring his pressure back up. She has been instructed to make sure he stays hydrated and to eat salty foods. If it continues the patient may need to go to the ED for IV fluid. The wife would like for Dr. Stanford Breed to know and to get his advice as well. Message routed.

## 2018-02-24 NOTE — Progress Notes (Signed)
HPI: FU hypertension. Carotid Dopplers March 2012 showed 1-39% bilateral stenosis.  Stress echocardiogram March 2012 was normal.  Upper ext arterial Dopplers April 2014 normal.  Echocardiogram September 2015 showed normal LV function.  CTA September 2015 showed no acute vascular abnormalities in upper extremities.  There was nonspecific narrowing of the left subclavian.  Medications adjusted at last office visit for hypertension. Since last seen, the patient denies any dyspnea on exertion, orthopnea, PND, pedal edema, palpitations, syncope or chest pain.   Current Outpatient Medications  Medication Sig Dispense Refill  . levothyroxine (SYNTHROID) 150 MCG tablet Take 150 mcg by mouth daily before breakfast.    . losartan (COZAAR) 50 MG tablet Take 50 mg by mouth daily.  6  . omeprazole (PRILOSEC) 40 MG capsule Take 40 mg by mouth daily.   1   No current facility-administered medications for this visit.      Past Medical History:  Diagnosis Date  . Esophageal stricture   . GERD (gastroesophageal reflux disease)   . Hypertension   . Hypothyroidism   . 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 TONSILLECTOMY      Social History   Socioeconomic History  . Marital status: Married    Spouse name: Not on file  . Number of children: 4  . Years of education: Not on file  . Highest education level: Not on file  Occupational History    Employer: [employer]    Comment: Architectural technologist  Social Needs  . Financial resource strain: Not on file  . Food insecurity:    Worry: Not on file    Inability: Not on file  . Transportation needs:    Medical: Not on file    Non-medical: Not on file  Tobacco Use  . Smoking status: Former Research scientist (life sciences)  . Smokeless tobacco: Never Used  Substance and Sexual Activity  . Alcohol use: Yes    Comment: Rare  . Drug use: Not on file  . Sexual activity: Not on file  Lifestyle    . Physical activity:    Days per week: Not on file    Minutes per session: Not on file  . Stress: Not on file  Relationships  . Social connections:    Talks on phone: Not on file    Gets together: Not on file    Attends religious service: Not on file    Active member of club or organization: Not on file    Attends meetings of clubs or organizations: Not on file    Relationship status: Not on file  . Intimate partner violence:    Fear of current or ex partner: Not on file    Emotionally abused: Not on file    Physically abused: Not on file    Forced sexual activity: Not on file  Other Topics Concern  . Not on file  Social History Narrative  . Not on file    Family History  Problem Relation Age of Onset  . Heart disease Maternal Grandfather   . Heart disease Maternal Grandmother     ROS: no fevers or chills, productive cough, hemoptysis, dysphasia, odynophagia, melena, hematochezia, dysuria, hematuria, rash, seizure activity, orthopnea, PND, pedal edema, claudication. Remaining systems are negative.  Physical Exam: Well-developed well-nourished in no acute distress.  Skin is warm and dry.  HEENT is normal.  Neck is supple.  Chest is clear to auscultation  with normal expansion.  Cardiovascular exam is regular rate and rhythm.  Abdominal exam nontender or distended. No masses palpated. Extremities show no edema. neuro grossly intact   A/P  1 hypertension-blood pressure is elevated today.  However he states it was controlled at home and higher doses of medications made him fatigued previously.  I have asked him to follow this and we will advance regimen as needed.  Our goal will be systolic less than 494 and diastolic 85 or less.  2 gastroesophageal reflux disease-managed by primary care.  Kirk Ruths, MD

## 2018-02-25 ENCOUNTER — Encounter: Payer: Self-pay | Admitting: Cardiology

## 2018-02-25 ENCOUNTER — Ambulatory Visit (INDEPENDENT_AMBULATORY_CARE_PROVIDER_SITE_OTHER): Payer: 59 | Admitting: Cardiology

## 2018-02-25 VITALS — BP 140/98 | HR 68 | Ht 72.0 in | Wt 202.0 lb

## 2018-02-25 DIAGNOSIS — I10 Essential (primary) hypertension: Secondary | ICD-10-CM

## 2018-02-25 NOTE — Patient Instructions (Signed)
Your physician wants you to follow-up in: ONE YEAR WITH DR CRENSHAW You will receive a reminder letter in the mail two months in advance. If you don't receive a letter, please call our office to schedule the follow-up appointment.   If you need a refill on your cardiac medications before your next appointment, please call your pharmacy.  

## 2018-12-21 ENCOUNTER — Other Ambulatory Visit: Payer: Self-pay

## 2018-12-21 ENCOUNTER — Other Ambulatory Visit: Payer: Self-pay | Admitting: Cardiology

## 2018-12-21 MED ORDER — LOSARTAN POTASSIUM 50 MG PO TABS: 50.0000 mg | ORAL_TABLET | Freq: Every day | ORAL | 3 refills | Status: DC

## 2018-12-21 NOTE — Telephone Encounter (Signed)
refilled losartan 50 mg daily, #90 with RF:3

## 2018-12-21 NOTE — Telephone Encounter (Signed)
New Message     *STAT* If patient is at the pharmacy, call can be transferred to refill team.   1. Which medications need to be refilled? (please list name of each medication and dose if known) Losartan 50 mg   2. Which pharmacy/location (including street and city if local pharmacy) is medication to be sent to? [facility] in [place]   3. Do they need a 30 day or 90 day supply? [place]

## 2018-12-22 ENCOUNTER — Other Ambulatory Visit: Payer: Self-pay

## 2018-12-22 MED ORDER — LOSARTAN POTASSIUM 50 MG PO TABS: 50.0000 mg | ORAL_TABLET | Freq: Every day | ORAL | 3 refills | Status: DC

## 2019-03-03 ENCOUNTER — Telehealth: Payer: Self-pay | Admitting: *Deleted

## 2019-03-03 NOTE — Telephone Encounter (Signed)
A message was left, re: follow up visit. 

## 2019-07-05 ENCOUNTER — Other Ambulatory Visit: Payer: Self-pay

## 2019-07-05 MED ORDER — LOSARTAN POTASSIUM 50 MG PO TABS: 50.0000 mg | ORAL_TABLET | Freq: Every day | ORAL | 0 refills | Status: DC

## 2019-08-05 ENCOUNTER — Other Ambulatory Visit: Payer: Self-pay | Admitting: Cardiology

## 2019-09-10 ENCOUNTER — Other Ambulatory Visit: Payer: Self-pay | Admitting: Cardiology

## 2019-10-01 ENCOUNTER — Other Ambulatory Visit: Payer: Self-pay | Admitting: Cardiology

## 2019-10-04 ENCOUNTER — Other Ambulatory Visit: Payer: Self-pay

## 2019-11-10 ENCOUNTER — Telehealth: Payer: Self-pay | Admitting: Cardiology

## 2019-11-10 ENCOUNTER — Other Ambulatory Visit: Payer: Self-pay | Admitting: *Deleted

## 2019-11-10 MED ORDER — LOSARTAN POTASSIUM 50 MG PO TABS: ORAL_TABLET | ORAL | 0 refills | Status: DC

## 2019-11-10 NOTE — Telephone Encounter (Signed)
*  STAT* If patient is at the pharmacy, call can be transferred to refill team.   1. Which medications need to be refilled? (please list name of each medication and dose if known)  losartan (COZAAR) 50 MG tablet  2. Which pharmacy/location (including street and city if local pharmacy) is medication to be sent to?  [id number] [place], [place] - [address]  3. Do they need a 30 day or 90 day supply? 30 day supply  Patient is currently out of medication. He has an appt scheduled for 12/08/19.

## 2019-11-10 NOTE — Telephone Encounter (Signed)
Refill for 30 days only . No more until seen by MD

## 2019-11-25 NOTE — Progress Notes (Signed)
HPI: FU hypertension. Carotid Dopplers March 2012 showed 1-39% bilateral stenosis. Stress echocardiogram March 2012 was normal. Upper extarterial Dopplers April 2014 normal. Echocardiogram September 2015 showed normal LV function. CTA September 2015 showed no acute vascular abnormalities in upper extremities. There was nonspecific narrowing of the left subclavian. Since last seen, the patient denies any dyspnea on exertion, orthopnea, PND, pedal edema, palpitations, syncope or chest pain.   Current Outpatient Medications  Medication Sig Dispense Refill  . levothyroxine (SYNTHROID) 150 MCG tablet Take 150 mcg by mouth daily before breakfast.    . losartan (COZAAR) 50 MG tablet TAKE 1 TABLET BY MOUTH EVERY DAY 30 tablet 0  . omeprazole (PRILOSEC) 40 MG capsule Take 40 mg by mouth daily.   1   No current facility-administered medications for this visit.     Past Medical History:  Diagnosis Date  . Esophageal stricture   . GERD (gastroesophageal reflux disease)   . Hypertension   . Hypothyroidism   . Throat cancer [facility])    Radiation    Past Surgical History:  Procedure Laterality Date  . TONSILLECTOMY      Social History   Socioeconomic History  . Marital status: Married    Spouse name: Not on file  . Number of children: 4  . Years of education: Not on file  . Highest education level: Not on file  Occupational History    Employer: [employer]    Comment: Architectural technologist  Tobacco Use  . Smoking status: Former Research scientist (life sciences)  . Smokeless tobacco: Never Used  Substance and Sexual Activity  . Alcohol use: Yes    Comment: Rare  . Drug use: Not on file  . Sexual activity: Not on file  Other Topics Concern  . Not on file  Social History Narrative  . Not on file   Social Determinants of Health   Financial Resource Strain:   . Difficulty of Paying Living Expenses:   Food Insecurity:   . Worried About Charity fundraiser in the Last Year:   .  Arboriculturist in the Last Year:   Transportation Needs:   . Film/video editor (Medical):   Marland Kitchen Lack of Transportation (Non-Medical):   Physical Activity:   . Days of Exercise per Week:   . Minutes of Exercise per Session:   Stress:   . Feeling of Stress :   Social Connections:   . Frequency of Communication with Friends and Family:   . Frequency of Social Gatherings with Friends and Family:   . Attends Religious Services:   . Active Member of Clubs or Organizations:   . Attends Archivist Meetings:   Marland Kitchen Marital Status:   Intimate Partner Violence:   . Fear of Current or Ex-Partner:   . Emotionally Abused:   Marland Kitchen Physically Abused:   . Sexually Abused:     Family History  Problem Relation Age of Onset  . Heart disease Maternal Grandfather   . Heart disease Maternal Grandmother     ROS: Some swallowing difficulties from previous neck radiation but no fevers or chills, productive cough, hemoptysis, dysphasia, odynophagia, melena, hematochezia, dysuria, hematuria, rash, seizure activity, orthopnea, PND, pedal edema, claudication. Remaining systems are negative.  Physical Exam: Well-developed well-nourished in no acute distress.  Skin is warm and dry.  HEENT is normal.  Neck is supple.  Evidence of previous radiation. Chest is clear to auscultation with normal expansion.  Cardiovascular exam is regular rate  and rhythm.  Abdominal exam nontender or distended. No masses palpated. Extremities show no edema. neuro grossly intact  ECG-normal sinus rhythm at a rate of 62, no ST changes.  Personally reviewed  A/P  1 hypertension-patient's blood pressure is mildly elevated; I have asked him to follow this and we will increase medications as needed.  Goal systolic blood pressure less than [id number] and diastolic less than 85.  I have recommended that he establish with a primary care physician.  I will check lipids, liver, bmet and CBC.  2 Gastrosoft reflux disease-Per primary  care.  Kirk Ruths, MD

## 2019-12-03 ENCOUNTER — Other Ambulatory Visit: Payer: Self-pay | Admitting: Cardiology

## 2019-12-08 ENCOUNTER — Ambulatory Visit: Payer: 59 | Admitting: Cardiology

## 2019-12-08 ENCOUNTER — Encounter: Payer: Self-pay | Admitting: Cardiology

## 2019-12-08 ENCOUNTER — Other Ambulatory Visit: Payer: Self-pay

## 2019-12-08 VITALS — BP 138/86 | HR 62 | Ht 72.0 in | Wt 197.0 lb

## 2019-12-08 DIAGNOSIS — I10 Essential (primary) hypertension: Secondary | ICD-10-CM | POA: Diagnosis not present

## 2019-12-08 NOTE — Patient Instructions (Signed)
Medication Instructions:  NO CHANGE *If you need a refill on your cardiac medications before your next appointment, please call your pharmacy*   Lab Work: Your physician recommends that you HAVE LAB WORK TODAY If you have labs (blood work) drawn today and your tests are completely normal, you will receive your results only by: Marland Kitchen MyChart Message (if you have MyChart) OR . A paper copy in the mail If you have any lab test that is abnormal or we need to change your treatment, we will call you to review the results.   Follow-Up: At [facility], you and your health needs are our priority.  As part of our continuing mission to provide you with exceptional heart care, we have created designated Provider Care Teams.  These Care Teams include your primary Cardiologist (physician) and Advanced Practice Providers (APPs -  Physician Assistants and Nurse Practitioners) who all work together to provide you with the care you need, when you need it.  We recommend signing up for the patient portal called "MyChart".  Sign up information is provided on this After Visit Summary.  MyChart is used to connect with patients for Virtual Visits (Telemedicine).  Patients are able to view lab/test results, encounter notes, upcoming appointments, etc.  Non-urgent messages can be sent to your provider as well.   To learn more about what you can do with MyChart, go to [email or web address].    Your next appointment:   12 month(s)  The format for your next appointment:   Either In Person or Virtual  Provider:   Kirk Ruths, MD

## 2019-12-09 ENCOUNTER — Encounter: Payer: Self-pay | Admitting: *Deleted

## 2019-12-09 LAB — LIPID PANEL
Chol/HDL Ratio: 2.8 ratio (ref 0.0–5.0)
Cholesterol, Total: 168 mg/dL (ref 100–199)
HDL: 59 mg/dL (ref >39)
LDL Chol Calc (NIH): 95 mg/dL (ref 0–99)
Triglycerides: 71 mg/dL (ref 0–149)
VLDL Cholesterol Cal: 14 mg/dL (ref 5–40)

## 2019-12-09 LAB — CBC
Hematocrit: 45.8 % (ref 37.5–51.0)
Hemoglobin: 15.5 g/dL (ref 13.0–17.7)
MCH: 29.5 pg (ref 26.6–33.0)
MCHC: 33.8 g/dL (ref 31.5–35.7)
MCV: 87 fL (ref 79–97)
Platelets: 285 10*3/uL (ref 150–450)
RBC: 5.25 x10E6/uL (ref 4.14–5.80)
RDW: 13.1 % (ref 11.6–15.4)
WBC: 7.1 10*3/uL (ref 3.4–10.8)

## 2019-12-09 LAB — COMPREHENSIVE METABOLIC PANEL
ALT: 21 IU/L (ref 0–44)
AST: 22 IU/L (ref 0–40)
Albumin/Globulin Ratio: 1.8 (ref 1.2–2.2)
Albumin: 4.3 g/dL (ref 3.8–4.9)
Alkaline Phosphatase: 71 IU/L (ref 39–117)
BUN/Creatinine Ratio: 9 (ref 9–20)
BUN: 9 mg/dL (ref 6–24)
Bilirubin Total: 0.4 mg/dL (ref 0.0–1.2)
CO2: 24 mmol/L (ref 20–29)
Calcium: 9.6 mg/dL (ref 8.7–10.2)
Chloride: 103 mmol/L (ref 96–106)
Creatinine, Ser: 0.97 mg/dL (ref 0.76–1.27)
GFR calc Af Amer: 100 mL/min/{1.73_m2} (ref >59)
GFR calc non Af Amer: 87 mL/min/{1.73_m2} (ref >59)
Globulin, Total: 2.4 g/dL (ref 1.5–4.5)
Glucose: 97 mg/dL (ref 65–99)
Potassium: 4.5 mmol/L (ref 3.5–5.2)
Sodium: 138 mmol/L (ref 134–144)
Total Protein: 6.7 g/dL (ref 6.0–8.5)

## 2019-12-10 ENCOUNTER — Other Ambulatory Visit: Payer: Self-pay | Admitting: Cardiology

## 2020-05-24 ENCOUNTER — Telehealth: Payer: Self-pay | Admitting: Cardiology

## 2020-05-24 DIAGNOSIS — Z79899 Other long term (current) drug therapy: Secondary | ICD-10-CM

## 2020-05-24 DIAGNOSIS — I10 Essential (primary) hypertension: Secondary | ICD-10-CM

## 2020-05-24 NOTE — Telephone Encounter (Signed)
Patient's wife is returning call. 

## 2020-05-24 NOTE — Telephone Encounter (Signed)
Return call to wife. She report pt was dx with COVID on 04/10/20 and since have been extremely tired and BP has been elevated. She report yesterday BP was 153/107 and today 153/109. Pt report occasional dizziness when he stands or bend over. Wife is requesting to schedule an appointment.   Appointment schedule for 9/17 for further evaluations.

## 2020-05-24 NOTE — Telephone Encounter (Signed)
Forwarded patient's advice request to Dr Stanford Breed and his nurse.

## 2020-05-24 NOTE — Telephone Encounter (Signed)
Pt c/o BP issue: STAT if pt c/o blurred vision, one-sided weakness or slurred speech  1. What are your last 5 BP readings? 153/107 yesterday, today 153/109  2. Are you having any other symptoms (ex. Dizziness, headache, blurred vision, passed out)?some shortness of breath, extremely tired- pt had COVID on8-2-21  3. What is your BP issue? High blood pressure- pt wants to be seen asap please

## 2020-05-24 NOTE — Telephone Encounter (Signed)
Left message to call back to discuss further  Patient scheduled to see Shanon Rosser PA 9/17

## 2020-05-25 MED ORDER — LOSARTAN POTASSIUM 100 MG PO TABS
100.0000 mg | ORAL_TABLET | Freq: Every day | ORAL | 3 refills | Status: DC
Start: 2020-05-25 — End: 2021-06-13

## 2020-05-25 NOTE — Addendum Note (Signed)
Addended by: Jacqulynn Cadet on: 05/25/2020 11:42 AM   Modules accepted: Orders

## 2020-05-25 NOTE — Telephone Encounter (Signed)
Increase losartan to 100 mg daily; bmet one week; agree with ov as planned [employer]

## 2020-05-25 NOTE — Telephone Encounter (Signed)
Called patient Mr. Troy Hull and informed him that Dr. Stanford Breed would like for him to increase his Losartan to 100 mg daily have BMET blood work in 1 week and to have the 05/26/20 office visit as scheduled. Patient confirmed his pharmacy and verbalized understanding. Placed order for BMET and changed dose of Losartan medication. Sent new Rx to patient's pharmacy on file.

## 2020-05-26 ENCOUNTER — Other Ambulatory Visit: Payer: Self-pay

## 2020-05-26 ENCOUNTER — Ambulatory Visit (INDEPENDENT_AMBULATORY_CARE_PROVIDER_SITE_OTHER): Payer: 59 | Admitting: Medical

## 2020-05-26 VITALS — BP 118/75 | HR 83 | Ht 72.0 in | Wt 191.0 lb

## 2020-05-26 DIAGNOSIS — I10 Essential (primary) hypertension: Secondary | ICD-10-CM | POA: Diagnosis not present

## 2020-05-26 NOTE — Progress Notes (Signed)
Cardiology Office Note   Date:  05/29/2020   ID:  Avik, Leoni March 02, 1966, MRN [id number]  PCP:  Florina Ou, MD (Inactive)  Cardiologist:  Kirk Ruths, MD EP: None  Chief Complaint  Patient presents with  . Hypertension      History of Present Illness: Troy Hull is a 54 y.o. male with PMH of HTN, hypothyroidism, throat cancer s/p radiation, GERD, and recent COVID-19 infection 04/2020, who presents for elevated blood pressures.   He was last evaluated by cardiology at an outpatient visit with Dr. Stanford Breed 11/2019 at which time he was doing well from a cardiac standpoint. No medication changes occurred at this visit and he was recommended to follow-up in 1 year. His last echo in 2015 showed EF 55-60%, no RWMA, and no significant valvular abnormalities. His last ischemic evaluation was a stress echo in 2012 which was without ischemia.   He contacted our office yesterday to report elevated blood pressure for the past several days. SBP typically in the 150s-170s. He reports his blood pressure has been elevated since his COVID infection last month. He reports a fullness sensation in his head when his BP is elevated. He increased his losartan to 100mg  daily as instructed by Dr. Stanford Breed and today his BP is much better. He reports the fullness in his head has resolved as well. He states he is feeling the best today that he has in over a month. He denies significant increase in salt intake. 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 He does have occasional lightheadedness with quick position changes, particularly if its hot outside. We discussed orthostatic precautions. No complaints of chest pain, palpitations, pre-syncope, syncope, orthopnea, PND, LE edema, or SOB.    Past Medical History:  Diagnosis Date  . Esophageal stricture   . GERD (gastroesophageal reflux disease)     . Hypertension   . Hypothyroidism   . Throat cancer [facility])    Radiation    Past Surgical History:  Procedure Laterality Date  . TONSILLECTOMY       Current Outpatient Medications  Medication Sig Dispense Refill  . levothyroxine (SYNTHROID) 150 MCG tablet Take 150 mcg by mouth daily before breakfast.    . losartan (COZAAR) 100 MG tablet Take 1 tablet (100 mg total) by mouth daily. 90 tablet 3  . omeprazole (PRILOSEC) 40 MG capsule Take 40 mg by mouth daily.   1   No current facility-administered medications for this visit.    Allergies:   Patient has no known allergies.    Social History:  The patient  reports that he has quit smoking. He has never used smokeless tobacco. He reports current alcohol use.   Family History:  The patient's family history includes Heart disease in his maternal grandfather and maternal grandmother.    ROS:  Please see the history of present illness.   Otherwise, review of systems are positive for none. All other systems are reviewed and negative.    PHYSICAL EXAM: VS:  BP 118/75   Pulse 83   Ht 6' (1.829 m)   Wt 191 lb (86.6 kg)   SpO2 96%   BMI 25.90 kg/m  , BMI Body mass index is 25.9 kg/m. GEN: Well nourished, well developed, in no acute distress HEENT: sclera anicteric Neck: no JVD, carotid bruits, or masses Cardiac: RRR; no murmurs, rubs, or gallops, no edema  Respiratory:  clear to auscultation bilaterally, normal work of breathing GI: soft, nontender, nondistended, + BS MS: no deformity or atrophy Skin: warm and dry, no rash Neuro:  Strength and sensation are intact Psych: euthymic mood, full affect   EKG:  EKG is not ordered today.  Recent Labs: 12/08/2019: ALT 21; BUN 9; Creatinine, Ser 0.97; Hemoglobin 15.5; Platelets 285; Potassium 4.5; Sodium 138    Lipid Panel    Component Value Date/Time   CHOL 168 12/08/2019 0844   TRIG 71 12/08/2019 0844   HDL 59 12/08/2019 0844   CHOLHDL 2.8 12/08/2019 0844   LDLCALC 95  12/08/2019 0844      Wt Readings from Last 3 Encounters:  05/26/20 191 lb (86.6 kg)  12/08/19 197 lb (89.4 kg)  02/25/18 202 lb 0.3 oz (91.6 kg)      Other studies Reviewed: Additional studies/ records that were reviewed today include:  Echocardiogram 2015: - Left ventricle: The cavity size was normal. Systolic function was  normal. The estimated ejection fraction was in the range of 55%  to 60%. Wall motion was normal; there were no regional wall  motion abnormalities.      ASSESSMENT AND PLAN:  1. HTN: BP much improved today after increasing losartan to 100mg  daily - Continue losartan 100mg  daily - Plan for repeat BMET in 1 week for close monitoring of his kidney function/electrolytes - Encouraged a low sodium diet.   2. COVID-19: getting his energy back from his COVID-19 infection last month. He is looking forward to getting vaccinated once he is eligible.  - Continue to encourage vaccination post-infection   Current medicines are reviewed at length with the patient today.  The patient does not have concerns regarding medicines.  The following changes have been made:  As above  Labs/ tests ordered today include:   Orders Placed This Encounter  Procedures  . Basic Metabolic Panel (BMET)     Disposition:   FU with Dr. Stanford Breed in 6 months.  Signed, Abigail Butts, PA-C  05/29/2020 1:25 PM

## 2020-05-26 NOTE — Patient Instructions (Signed)
Medication Instructions:  No Medication Changes *If you need a refill on your cardiac medications before your next appointment, please call your pharmacy*   Lab Work: [facility] If you have labs (blood work) drawn today and your tests are completely normal, you will receive your results only by: Marland Kitchen MyChart Message (if you have MyChart) OR . A paper copy in the mail If you have any lab test that is abnormal or we need to change your treatment, we will call you to review the results.   Testing/Procedures: None   Follow-Up: At [facility], you and your health needs are our priority.  As part of our continuing mission to provide you with exceptional heart care, we have created designated Provider Care Teams.  These Care Teams include your primary Cardiologist (physician) and Advanced Practice Providers (APPs -  Physician Assistants and Nurse Practitioners) who all work together to provide you with the care you need, when you need it.  We recommend signing up for the patient portal called "MyChart".  Sign up information is provided on this After Visit Summary.  MyChart is used to connect with patients for Virtual Visits (Telemedicine).  Patients are able to view lab/test results, encounter notes, upcoming appointments, etc.  Non-urgent messages can be sent to your provider as well.   To learn more about what you can do with MyChart, go to [email or web address].    Your next appointment:   6 month(s)  The format for your next appointment:   In Person  Provider:   Kirk Ruths, MD   Other Instructions Monitor Blood Pressure . Call if BP over 130/80. [place]

## 2020-05-29 ENCOUNTER — Encounter: Payer: Self-pay | Admitting: Medical

## 2020-11-06 NOTE — Progress Notes (Deleted)
      HPI: FU hypertension.Carotid Dopplers March 2012 showed 1-39% bilateral stenosis. Stress echocardiogram March 2012 was normal. Upper extarterial Dopplers April 2014 normal. Echocardiogram September 2015 showed normal LV function. CTA September 2015 showed no acute vascular abnormalities in upper extremities. There was nonspecific narrowing of the left subclavian.Since last seen,   Current Outpatient Medications  Medication Sig Dispense Refill  . levothyroxine (SYNTHROID) 150 MCG tablet Take 150 mcg by mouth daily before breakfast.    . losartan (COZAAR) 100 MG tablet Take 1 tablet (100 mg total) by mouth daily. 90 tablet 3  . omeprazole (PRILOSEC) 40 MG capsule Take 40 mg by mouth daily.   1   No current facility-administered medications for this visit.     Past Medical History:  Diagnosis Date  . Esophageal stricture   . GERD (gastroesophageal reflux disease)   . Hypertension   . Hypothyroidism   . Throat cancer [facility])    Radiation    Past Surgical History:  Procedure Laterality Date  . TONSILLECTOMY      Social History   Socioeconomic History  . Marital status: Married    Spouse name: Not on file  . Number of children: 4  . Years of education: Not on file  . Highest education level: Not on file  Occupational History    Employer: [employer]    Comment: Architectural technologist  Tobacco Use  . Smoking status: Former Research scientist (life sciences)  . Smokeless tobacco: Never Used  Substance and Sexual Activity  . Alcohol use: Yes    Comment: Rare  . Drug use: Not on file  . Sexual activity: Not on file  Other Topics Concern  . Not on file  Social History Narrative  . Not on file   Social Determinants of Health   Financial Resource Strain: Not on file  Food Insecurity: Not on file  Transportation Needs: Not on file  Physical Activity: Not on file  Stress: Not on file  Social Connections: Not on file  Intimate Partner Violence: Not on file    Family  History  Problem Relation Age of Onset  . Heart disease Maternal Grandfather   . Heart disease Maternal Grandmother     ROS: no fevers or chills, productive cough, hemoptysis, dysphasia, odynophagia, melena, hematochezia, dysuria, hematuria, rash, seizure activity, orthopnea, PND, pedal edema, claudication. Remaining systems are negative.  Physical Exam: Well-developed well-nourished in no acute distress.  Skin is warm and dry.  HEENT is normal.  Neck is supple.  Chest is clear to auscultation with normal expansion.  Cardiovascular exam is regular rate and rhythm.  Abdominal exam nontender or distended. No masses palpated. Extremities show no edema. neuro grossly intact  ECG- personally reviewed  A/P  1 hypertension-patient's blood pressure is controlled.  Continue present medications and follow.  2 gastroesophageal reflux disease-Per primary care.  Kirk Ruths, MD

## 2020-11-20 ENCOUNTER — Ambulatory Visit: Payer: 59 | Admitting: Cardiology

## 2020-12-23 NOTE — Progress Notes (Signed)
      HPI: FU hypertension.Carotid Dopplers March 2012 showed 1-39% bilateral stenosis. Stress echocardiogram March 2012 was normal. Upper extarterial Dopplers April 2014 normal. Echocardiogram September 2015 showed normal LV function. CTA September 2015 showed no acute vascular abnormalities in upper extremities. There was nonspecific narrowing of the left subclavian.Since last seen,  there is no dyspnea, chest pain, palpitations or syncope.  Occasional numbness in his left upper extremity with pressure on his neck.  Current Outpatient Medications  Medication Sig Dispense Refill  . levothyroxine (SYNTHROID) 150 MCG tablet Take 150 mcg by mouth daily before breakfast.    . losartan (COZAAR) 100 MG tablet Take 1 tablet (100 mg total) by mouth daily. 90 tablet 3  . omeprazole (PRILOSEC) 40 MG capsule Take 40 mg by mouth daily.   1   No current facility-administered medications for this visit.     Past Medical History:  Diagnosis Date  . Esophageal stricture   . GERD (gastroesophageal reflux disease)   . Hypertension   . Hypothyroidism   . 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 TONSILLECTOMY      Social History   Socioeconomic History  . Marital status: Married    Spouse name: Not on file  . Number of children: 4  . Years of education: Not on file  . Highest education level: Not on file  Occupational History    Employer: [employer]    Comment: Architectural technologist  Tobacco Use  . Smoking status: Former Research scientist (life sciences)  . Smokeless tobacco: Never Used  Substance and Sexual Activity  . Alcohol use: Yes    Comment: Rare  . Drug use: Not on file  . Sexual activity: Not on file  Other Topics Concern  . Not on file  Social History Narrative  . Not on file   Social Determinants of Health   Financial Resource Strain: Not on file  Food Insecurity: Not on file  Transportation Needs: Not on file  Physical  Activity: Not on file  Stress: Not on file  Social Connections: Not on file  Intimate Partner Violence: Not on file    Family History  Problem Relation Age of Onset  . Heart disease Maternal Grandfather   . Heart disease Maternal Grandmother     ROS: no fevers or chills, productive cough, hemoptysis, dysphasia, odynophagia, melena, hematochezia, dysuria, hematuria, rash, seizure activity, orthopnea, PND, pedal edema, claudication. Remaining systems are negative.  Physical Exam: Well-developed well-nourished in no acute distress.  Skin is warm and dry.  HEENT is normal.  Neck is supple.  Evidence of previous radiation.  Left carotid bruit. Chest is clear to auscultation with normal expansion.  Cardiovascular exam is regular rate and rhythm.  2/6 systolic murmur. Abdominal exam nontender or distended. No masses palpated. Extremities show no edema. neuro grossly intact  ECG-normal sinus rhythm at a rate of 75, no ST changes.  Personally reviewed  A/P  1 hypertension-blood pressure controlled.  Continue present medications and follow.   2 bruit-schedule carotid Dopplers to rule out carotid disease.  May have some vasculopathy related to previous radiation.  3 systolic murmur-schedule echo to reassess.  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  4 gastroesophageal reflux disease-followed by primary care.  Kirk Ruths, MD

## 2020-12-29 ENCOUNTER — Other Ambulatory Visit: Payer: Self-pay

## 2020-12-29 ENCOUNTER — Ambulatory Visit: Payer: 59 | Admitting: Cardiology

## 2020-12-29 ENCOUNTER — Encounter: Payer: Self-pay | Admitting: Cardiology

## 2020-12-29 VITALS — BP 122/76 | HR 75 | Ht 72.0 in | Wt 190.0 lb

## 2020-12-29 DIAGNOSIS — R0989 Other specified symptoms and signs involving the circulatory and respiratory systems: Secondary | ICD-10-CM

## 2020-12-29 DIAGNOSIS — I10 Essential (primary) hypertension: Secondary | ICD-10-CM | POA: Diagnosis not present

## 2020-12-29 DIAGNOSIS — R011 Cardiac murmur, unspecified: Secondary | ICD-10-CM | POA: Diagnosis not present

## 2020-12-29 NOTE — Patient Instructions (Signed)
  Testing/Procedures:  Your physician has requested that you have an echocardiogram. Echocardiography is a painless test that uses sound waves to create images of your heart. It provides your doctor with information about the size and shape of your heart and how well your heart's chambers and valves are working. This procedure takes approximately one hour. There are no restrictions for this procedure.[facility] has requested that you have a carotid duplex. This test is an ultrasound of the carotid arteries in your neck. It looks at blood flow through these arteries that supply the brain with blood. Allow one hour for this exam. There are no restrictions or special instructions.[facility]    Follow-Up: At [facility], you and your health needs are our priority.  As part of our continuing mission to provide you with exceptional heart care, we have created designated Provider Care Teams.  These Care Teams include your primary Cardiologist (physician) and Advanced Practice Providers (APPs -  Physician Assistants and Nurse Practitioners) who all work together to provide you with the care you need, when you need it.  We recommend signing up for the patient portal called "MyChart".  Sign up information is provided on this After Visit Summary.  MyChart is used to connect with patients for Virtual Visits (Telemedicine).  Patients are able to view lab/test results, encounter notes, upcoming appointments, etc.  Non-urgent messages can be sent to your provider as well.   To learn more about what you can do with MyChart, go to [email or web address].    Your next appointment:   12 month(s)  The format for your next appointment:   In Person  Provider:   Kirk Ruths, MD

## 2021-01-08 ENCOUNTER — Ambulatory Visit (HOSPITAL_COMMUNITY)
Admission: RE | Admit: 2021-01-08 | Discharge: 2021-01-08 | Disposition: A | Discharge status: Home / Self Care | Payer: 59 | Source: Ambulatory Visit | Attending: Cardiology | Admitting: Cardiology

## 2021-01-08 ENCOUNTER — Other Ambulatory Visit: Payer: Self-pay

## 2021-01-08 DIAGNOSIS — R0989 Other specified symptoms and signs involving the circulatory and respiratory systems: Secondary | ICD-10-CM | POA: Diagnosis not present

## 2021-01-09 ENCOUNTER — Encounter: Payer: Self-pay | Admitting: *Deleted

## 2021-01-31 ENCOUNTER — Other Ambulatory Visit (HOSPITAL_COMMUNITY): Payer: 59

## 2021-01-31 ENCOUNTER — Telehealth: Payer: Self-pay | Admitting: Cardiology

## 2021-01-31 ENCOUNTER — Encounter (HOSPITAL_COMMUNITY): Payer: Self-pay | Admitting: Cardiology

## 2021-01-31 NOTE — Telephone Encounter (Signed)
Patient wife called, wanted to know if patient had to get blood work today?  I did not see orders, can you call patient please.

## 2021-01-31 NOTE — Telephone Encounter (Signed)
Left message for patient wife, aware the patient was scheduled for an echocardiogram this morning, he does not need blood work. They were ask to call back and reschedule.

## 2021-02-19 ENCOUNTER — Ambulatory Visit (HOSPITAL_COMMUNITY): Payer: 59 | Attending: Cardiovascular Disease

## 2021-02-19 ENCOUNTER — Other Ambulatory Visit: Payer: Self-pay

## 2021-02-19 DIAGNOSIS — R011 Cardiac murmur, unspecified: Secondary | ICD-10-CM | POA: Diagnosis not present

## 2021-02-19 LAB — ECHOCARDIOGRAM COMPLETE
AR max vel: 1.61 cm2
AV Area VTI: 1.7 cm2
AV Area mean vel: 1.59 cm2
AV Mean grad: 8 mmHg
AV Peak grad: 16.5 mmHg
Ao pk vel: 2.03 m/s
Area-P 1/2: 3.13 cm2
S' Lateral: 2.5 cm

## 2021-02-20 ENCOUNTER — Encounter: Payer: Self-pay | Admitting: *Deleted

## 2021-06-13 ENCOUNTER — Other Ambulatory Visit: Payer: Self-pay | Admitting: Cardiology

## 2022-01-04 ENCOUNTER — Encounter: Payer: Self-pay | Admitting: Physician Assistant

## 2022-01-04 ENCOUNTER — Ambulatory Visit: Payer: 59 | Admitting: Physician Assistant

## 2022-01-04 ENCOUNTER — Other Ambulatory Visit: Payer: Self-pay

## 2022-01-04 VITALS — BP 110/74 | HR 67 | Ht 72.0 in | Wt 195.8 lb

## 2022-01-04 DIAGNOSIS — E785 Hyperlipidemia, unspecified: Secondary | ICD-10-CM | POA: Diagnosis not present

## 2022-01-04 DIAGNOSIS — I10 Essential (primary) hypertension: Secondary | ICD-10-CM | POA: Diagnosis not present

## 2022-01-04 MED ORDER — LOSARTAN POTASSIUM 100 MG PO TABS: 100.0000 mg | ORAL_TABLET | Freq: Every day | ORAL | 3 refills | Status: DC

## 2022-01-04 NOTE — Progress Notes (Signed)
?Cardiology Office Note:   ? ?Date:  01/06/2022  ? ?ID:  Troy Hull, DOB [birth year], MRN [id number] ? ?PCP:  Aletha Halim., PA-C ?  ?[facility] Providers ?Cardiologist:  Kirk Ruths, MD    ? ?Referring MD: Aletha Halim., PA-C  ? ?Chief Complaint  ?Patient presents with  ? Follow-up  ?  Seen for Dr. Stanford Breed  ? ? ?History of Present Illness:   ? ?Troy Hull is a 56 y.o. male with a hx of carotid artery stenosis, hypertension, hypothyroidism and a history of throat cancer.  Carotid Doppler in March 2012 showed 1 to 39% bilateral stenosis.  Stress echocardiogram in March 2012 was normal.  Upper extremity Doppler in April 2014 was normal.  Echocardiogram in September 2015 showed normal EF.  CTA in September 2015 showed no acute vascular abnormality in the upper extremity, there was nonspecific narrowing in the left subclavian vessel.  He was last seen by Dr. Stanford Breed on 12/29/2020 at which time he was doing well.  Carotid Doppler was repeated on 01/08/2021 that showed 1 to 39% bilateral disease.  Repeat echocardiogram obtained on 02/18/2021 showed EF 66%, no regional wall motion abnormality, no significant valve issue. ? ?Patient presents today for 1 year follow-up.  He denies any recent exertional chest pain or worsening dyspnea.  Lab work obtained by his PCP in May of last year showed stable renal function and electrolyte, normal red blood cell count.  He did not have a lipid panel in the last year.  He is due for another annual follow-up with his PCP in May of this year and we will obtain blood work at that time.  He has no lower extremity edema, orthopnea or PND.  He can follow-up weight in 1 year.  I will refill his losartan. ? ?Past Medical History:  ?Diagnosis Date  ? Esophageal stricture   ? GERD (gastroesophageal reflux disease)   ? Hypertension   ? Hypothyroidism   ? Throat cancer ([place])   ? Radiation  ? ? ?Past Surgical History:  ?Procedure Laterality Date  ? TONSILLECTOMY     ? ? ?Current Medications: ?Current Meds  ?Medication Sig  ? levothyroxine (SYNTHROID) 150 MCG tablet Take 150 mcg by mouth daily before breakfast.  ? omeprazole (PRILOSEC) 40 MG capsule Take 40 mg by mouth daily.   ? [DISCONTINUED] losartan (COZAAR) 100 MG tablet TAKE 1 TABLET BY MOUTH DAILY  ?  ? ?Allergies:   Patient has no known allergies.  ? ?Social History  ? ?Socioeconomic History  ? Marital status: Married  ?  Spouse name: Not on file  ? Number of children: 4  ? Years of education: Not on file  ? Highest education level: Not on file  ?Occupational History  ?  Employer: [employer]  ?  Comment: Architectural technologist  ?Tobacco Use  ? Smoking status: Former  ? Smokeless tobacco: Never  ?Substance and Sexual Activity  ? Alcohol use: Yes  ?  Comment: Rare  ? Drug use: Not on file  ? Sexual activity: Not on file  ?Other Topics Concern  ? Not on file  ?Social History Narrative  ? Not on file  ? ?Social Determinants of Health  ? ?Financial Resource Strain: Not on file  ?Food Insecurity: Not on file  ?Transportation Needs: Not on file  ?Physical Activity: Not on file  ?Stress: Not on file  ?Social Connections: Not on file  ?  ? ?Family History: ?The patient's family history  includes Heart disease in his maternal grandfather and maternal grandmother. ? ?ROS:   ?Please see the history of present illness.    ? All other systems reviewed and are negative. ? ?EKGs/Labs/Other Studies Reviewed:   ? ?The following studies were reviewed today: ? ?Echo 02/19/2021 ?1. Left ventricular ejection fraction by 3D volume is 66 %. The left  ?ventricle has normal function. The left ventricle has no regional wall  ?motion abnormalities. Left ventricular diastolic parameters were normal.  ? 2. Right ventricular systolic function is normal. The right ventricular  ?size is normal.  ? 3. The mitral valve is normal in structure. No evidence of mitral valve  ?regurgitation. No evidence of mitral stenosis.  ? 4. The aortic  valve is normal in structure. Aortic valve regurgitation is  ?not visualized. No aortic stenosis is present.  ? ?EKG:  EKG is ordered today.  The ekg ordered today demonstrates normal sinus rhythm, no significant ST-T wave changes. ? ?Recent Labs: ?No results found for requested labs within last 8760 hours.  ?Recent Lipid Panel ?   ?Component Value Date/Time  ? CHOL 168 12/08/2019 0844  ? TRIG 71 12/08/2019 0844  ? HDL 59 12/08/2019 0844  ? CHOLHDL 2.8 12/08/2019 0844  ? Ferney 95 12/08/2019 0844  ? ? ? ?Risk Assessment/Calculations:   ?  ? ?    ? ?Physical Exam:   ? ?VS:  BP 110/74   Pulse 67   Ht 6' (1.829 m)   Wt 195 lb 12.8 oz (88.8 kg)   SpO2 98%   BMI 26.56 kg/m?    ? ?Wt Readings from Last 3 Encounters:  ?01/04/22 195 lb 12.8 oz (88.8 kg)  ?12/29/20 190 lb 0.3 oz (86.2 kg)  ?05/26/20 191 lb (86.6 kg)  ?  ? ?GEN:  Well nourished, well developed in no acute distress ?HEENT: Normal ?NECK: No JVD; No carotid bruits ?LYMPHATICS: No lymphadenopathy ?CARDIAC: RRR, no murmurs, rubs, gallops ?RESPIRATORY:  Clear to auscultation without rales, wheezing or rhonchi  ?ABDOMEN: Soft, non-tender, non-distended ?MUSCULOSKELETAL:  No edema; No deformity  ?SKIN: Warm and dry ?NEUROLOGIC:  Alert and oriented x 3 ?PSYCHIATRIC:  Normal affect  ? ?ASSESSMENT:   ? ?1. Essential hypertension   ?2. Hyperlipidemia LDL goal <100   ? ?PLAN:   ? ?In order of problems listed above: ? ?Hypertension: On losartan 100 mg daily.  Blood pressure stable, denies any exertional symptoms ? ?Hyperlipidemia: Not on any cholesterol medication.  Due for fasting lipid panel in May of this year by PCP. ? ?   ? ?   ? ? ?Medication Adjustments/Labs and Tests Ordered: ?Current medicines are reviewed at length with the patient today.  Concerns regarding medicines are outlined above.  ?Orders Placed This Encounter  ?Procedures  ? EKG 12-Lead  ? ?No orders of the defined types were placed in this encounter. ? ? ?Patient Instructions  ?Medication  Instructions:  ?Your physician recommends that you continue on your current medications as directed. Please refer to the Current Medication list given to you today. ? ?*If you need a refill on your cardiac medications before your next appointment, please call your pharmacy* ? ?Lab Work: ?NONE ordered at this time of appointment  ? ?If you have labs (blood work) drawn today and your tests are completely normal, you will receive your results only by: ?MyChart Message (if you have MyChart) OR ?A paper copy in the mail ?If you have any lab test that is abnormal or we need to  change your treatment, we will call you to review the results. ? ?Testing/Procedures: ?NONE ordered at this time of appointment  ? ?Follow-Up: ?At [facility], you and your health needs are our priority.  As part of our continuing mission to provide you with exceptional heart care, we have created designated Provider Care Teams.  These Care Teams include your primary Cardiologist (physician) and Advanced Practice Providers (APPs -  Physician Assistants and Nurse Practitioners) who all work together to provide you with the care you need, when you need it. ? ?We recommend signing up for the patient portal called "MyChart".  Sign up information is provided on this After Visit Summary.  MyChart is used to connect with patients for Virtual Visits (Telemedicine).  Patients are able to view lab/test results, encounter notes, upcoming appointments, etc.  Non-urgent messages can be sent to your provider as well.   ?To learn more about what you can do with MyChart, go to [email or web address].   ? ?Your next appointment:   ?1 year(s) ? ?The format for your next appointment:   ?In Person ? ?Provider:   ?Kirk Ruths, MD   ? ?Other Instructions ? ? ?Important Information About Sugar ? ? ? ? ?   ? ?Signed, ?Almyra Deforest, [place]  ?01/06/2022 9:38 PM    ?[place] ?

## 2022-01-04 NOTE — Patient Instructions (Signed)
Medication Instructions:  Your physician recommends that you continue on your current medications as directed. Please refer to the Current Medication list given to you today.   *If you need a refill on your cardiac medications before your next appointment, please call your pharmacy*   Lab Work: NONE ordered at this time of appointment   If you have labs (blood work) drawn today and your tests are completely normal, you will receive your results only by: MyChart Message (if you have MyChart) OR A paper copy in the mail If you have any lab test that is abnormal or we need to change your treatment, we will call you to review the results.   Testing/Procedures: NONE ordered at this time of appointment     Follow-Up: At [facility], you and your health needs are our priority.  As part of our continuing mission to provide you with exceptional heart care, we have created designated Provider Care Teams.  These Care Teams include your primary Cardiologist (physician) and Advanced Practice Providers (APPs -  Physician Assistants and Nurse Practitioners) who all work together to provide you with the care you need, when you need it.  We recommend signing up for the patient portal called "MyChart".  Sign up information is provided on this After Visit Summary.  MyChart is used to connect with patients for Virtual Visits (Telemedicine).  Patients are able to view lab/test results, encounter notes, upcoming appointments, etc.  Non-urgent messages can be sent to your provider as well.   To learn more about what you can do with MyChart, go to [email or web address].    Your next appointment:   1 year(s)  The format for your next appointment:   In Person  Provider:   Brian Crenshaw, MD     Other Instructions   Important Information About Sugar       

## 2022-01-04 NOTE — Addendum Note (Signed)
Addended by: Jacqulynn Cadet on: 01/04/2022 09:40 AM ? ? Modules accepted: Orders ? ?

## 2022-01-06 ENCOUNTER — Encounter: Payer: Self-pay | Admitting: Physician Assistant

## 2023-01-19 NOTE — Progress Notes (Unsigned)
     HPI: FU hypertension. Stress echocardiogram March 2012 was normal. Upper ext arterial Dopplers April 2014 normal. CTA September 2015 showed no acute vascular abnormalities in upper extremities.  There was nonspecific narrowing of the left subclavian.  Carotid Dopplers May 2022 showed 1 to 39% bilateral stenosis.  There was note of greater than 50% plaque in the common carotid artery on the left.  Echocardiogram June 2022 showed normal LV function.  Since last seen, there is no dyspnea, chest pain, palpitations or syncope.  Current Outpatient Medications  Medication Sig Dispense Refill   levothyroxine (SYNTHROID) 150 MCG tablet Take 150 mcg by mouth daily before breakfast.     losartan (COZAAR) 100 MG tablet Take 1 tablet (100 mg total) by mouth daily. 90 tablet 3   omeprazole (PRILOSEC) 40 MG capsule Take 40 mg by mouth daily.   1   No current facility-administered medications for this visit.     Past Medical History:  Diagnosis Date   Esophageal stricture    GERD (gastroesophageal reflux disease)    Hypertension    Hypothyroidism    Throat cancer ([facility])    Radiation    Past Surgical History:  Procedure Laterality Date   TONSILLECTOMY      Social History   Socioeconomic History   Marital status: Married    Spouse name: Not on file   Number of children: 4   Years of education: Not on file   Highest education level: Not on file  Occupational History    Employer: [employer]    Comment: Geologist, engineering  Tobacco Use   Smoking status: Former   Smokeless tobacco: Never  Substance and Sexual Activity   Alcohol use: Yes    Comment: Rare   Drug use: Not on file   Sexual activity: Not on file  Other Topics Concern   Not on file  Social History Narrative   Not on file   Social Determinants of Health   Financial Resource Strain: Not on file  Food Insecurity: Not on file  Transportation Needs: Not on file  Physical Activity: Not on file   Stress: Not on file  Social Connections: Not on file  Intimate Partner Violence: Not on file    Family History  Problem Relation Age of Onset   Heart disease Maternal Grandfather    Heart disease Maternal Grandmother     ROS: no fevers or chills, productive cough, hemoptysis, dysphasia, odynophagia, melena, hematochezia, dysuria, hematuria, rash, seizure activity, orthopnea, PND, pedal edema, claudication. Remaining systems are negative.  Physical Exam: Well-developed well-nourished in no acute distress.  Skin is warm and dry.  HEENT is normal.  Neck is supple.  Chest is clear to auscultation with normal expansion.  Cardiovascular exam is regular rate and rhythm.  Abdominal exam nontender or distended. No masses palpated. Extremities show no edema. neuro grossly intact  ECG-sinus bradycardia at a rate of 58, no ST changes.  Personally reviewed  A/P  1 hypertension-patient's blood pressure is elevated; I have asked him to track this at home and we will advance regimen as needed.  2 carotid artery disease-we will arrange follow-up carotid Dopplers.  3 hyperlipidemia-will add statin if calcium score elevated.  4 family history of coronary artery disease-we will arrange calcium score for risk stratification.  Olga Millers, MD

## 2023-01-20 ENCOUNTER — Encounter: Payer: Self-pay | Admitting: Cardiology

## 2023-01-20 ENCOUNTER — Ambulatory Visit: Payer: No Typology Code available for payment source | Attending: Cardiology | Admitting: Cardiology

## 2023-01-20 VITALS — BP 138/98 | HR 58 | Ht 71.0 in | Wt 185.4 lb

## 2023-01-20 DIAGNOSIS — I10 Essential (primary) hypertension: Secondary | ICD-10-CM

## 2023-01-20 DIAGNOSIS — I6523 Occlusion and stenosis of bilateral carotid arteries: Secondary | ICD-10-CM

## 2023-01-20 DIAGNOSIS — E785 Hyperlipidemia, unspecified: Secondary | ICD-10-CM

## 2023-01-20 NOTE — Patient Instructions (Signed)
Medication Instructions:  No changes *If you need a refill on your cardiac medications before your next appointment, please call your pharmacy*  Testing/Procedures: Dr Jens Som has ordered a CT coronary calcium score.   Test locations:  [facility]  [place] [facility]  This is $99 out of pocket.   Coronary CalciumScan A coronary calcium scan is an imaging test used to look for deposits of calcium and other fatty materials (plaques) in the inner lining of the blood vessels of the heart (coronary arteries). These deposits of calcium and plaques can partly clog and narrow the coronary arteries without producing any symptoms or warning signs. This puts a person at risk for a heart attack. This test can detect these deposits before symptoms develop. Tell a health care provider about: Any allergies you have. All medicines you are taking, including vitamins, herbs, eye drops, creams, and over-the-counter medicines. Any problems you or family members have had with anesthetic medicines. Any blood disorders you have. Any surgeries you have had. Any medical conditions you have. Whether you are pregnant or may be pregnant. What are the risks? Generally, this is a safe procedure. However, problems may occur, including: Harm to a pregnant woman and her unborn baby. This test involves the use of radiation. Radiation exposure can be dangerous to a pregnant woman and her unborn baby. If you are pregnant, you generally should not have this procedure done. Slight increase in the risk of cancer. This is because of the radiation involved in the test. What happens before the procedure? No preparation is needed for this procedure. What happens during the procedure? You will undress and remove any jewelry around your neck or chest. You will put on a hospital gown. Sticky electrodes will be placed on your chest. The  electrodes will be connected to an electrocardiogram (ECG) machine to record a tracing of the electrical activity of your heart. A CT scanner will take pictures of your heart. During this time, you will be asked to lie still and hold your breath for 2-3 seconds while a picture of your heart is being taken. The procedure may vary among health care providers and hospitals. What happens after the procedure? You can get dressed. You can return to your normal activities. It is up to you to get the results of your test. Ask your health care provider, or the department that is doing the test, when your results will be ready. Summary A coronary calcium scan is an imaging test used to look for deposits of calcium and other fatty materials (plaques) in the inner lining of the blood vessels of the heart (coronary arteries). Generally, this is a safe procedure. Tell your health care provider if you are pregnant or may be pregnant. No preparation is needed for this procedure. A CT scanner will take pictures of your heart. You can return to your normal activities after the scan is done. This information is not intended to replace advice given to you by your health care provider. Make sure you discuss any questions you have with your health care provider. Document Released: 02/22/2008 Document Revised: 07/15/2016 Document Reviewed: 07/15/2016 Elsevier Interactive Patient Education  2017 [employer].   Your physician has requested that you have a carotid duplex. This test is an ultrasound of the carotid arteries in your neck. It looks at blood flow through these arteries that supply the brain with blood. Allow one hour for this exam. There  are no restrictions or special instructions. This will take place at [address] [facility], [address].    Follow-Up: At [facility], you and your health needs are our priority.  As part of our continuing mission to provide you with exceptional heart care, we have  created designated Provider Care Teams.  These Care Teams include your primary Cardiologist (physician) and Advanced Practice Providers (APPs -  Physician Assistants and Nurse Practitioners) who all work together to provide you with the care you need, when you need it.  We recommend signing up for the patient portal called "MyChart".  Sign up information is provided on this After Visit Summary.  MyChart is used to connect with patients for Virtual Visits (Telemedicine).  Patients are able to view lab/test results, encounter notes, upcoming appointments, etc.  Non-urgent messages can be sent to your provider as well.   To learn more about what you can do with MyChart, go to [email or web address].    Your next appointment:   1 year(s)  Provider:   Olga Millers, MD

## 2023-02-05 ENCOUNTER — Ambulatory Visit (HOSPITAL_COMMUNITY): Admission: RE | Admit: 2023-02-05 | Payer: No Typology Code available for payment source | Source: Ambulatory Visit

## 2023-02-17 ENCOUNTER — Ambulatory Visit (HOSPITAL_COMMUNITY)
Admission: RE | Admit: 2023-02-17 | Discharge: 2023-02-17 | Disposition: A | Discharge status: Home / Self Care | Payer: 59 | Source: Ambulatory Visit | Attending: Cardiology | Admitting: Cardiology

## 2023-02-17 DIAGNOSIS — I6523 Occlusion and stenosis of bilateral carotid arteries: Secondary | ICD-10-CM | POA: Insufficient documentation

## 2023-02-18 ENCOUNTER — Ambulatory Visit (HOSPITAL_BASED_OUTPATIENT_CLINIC_OR_DEPARTMENT_OTHER)
Admission: RE | Admit: 2023-02-18 | Discharge: 2023-02-18 | Disposition: A | Discharge status: Home / Self Care | Payer: No Typology Code available for payment source | Source: Ambulatory Visit | Attending: Cardiology | Admitting: Cardiology

## 2023-02-18 DIAGNOSIS — E785 Hyperlipidemia, unspecified: Secondary | ICD-10-CM | POA: Insufficient documentation

## 2023-02-19 ENCOUNTER — Telehealth: Payer: Self-pay | Admitting: Cardiology

## 2023-02-19 DIAGNOSIS — R931 Abnormal findings on diagnostic imaging of heart and coronary circulation: Secondary | ICD-10-CM

## 2023-02-19 MED ORDER — ROSUVASTATIN CALCIUM 40 MG PO TABS
40.0000 mg | ORAL_TABLET | Freq: Every day | ORAL | 3 refills | Status: DC
Start: 2023-02-19 — End: 2023-03-31

## 2023-02-19 MED ORDER — ASPIRIN 81 MG PO TBEC
81.0000 mg | DELAYED_RELEASE_TABLET | Freq: Every day | ORAL | 3 refills | Status: AC
Start: 2023-02-19 — End: ?

## 2023-02-19 NOTE — Telephone Encounter (Signed)
Pt returning call for results

## 2023-02-19 NOTE — Telephone Encounter (Signed)
Spoke with pt, aware of ct and carotid results. New script sent to the pharmacy  Lab orders mailed to the pt

## 2023-02-21 ENCOUNTER — Telehealth: Payer: Self-pay | Admitting: Cardiology

## 2023-02-21 NOTE — Telephone Encounter (Signed)
Spoke with pt, Follow up scheduled  

## 2023-02-21 NOTE — Telephone Encounter (Signed)
Patient wants a call back from Dr. Jens Som to discuss CT Cardiac Scoring test results.

## 2023-02-21 NOTE — Telephone Encounter (Signed)
Patient stated he spoke with debra about CT but he would like to speak with Dr. Jens Som about results for further details. He stated he tried to make an appointment to discuss but he is booked out for 6 months. He is aware provider is out of office

## 2023-03-11 ENCOUNTER — Telehealth: Payer: Self-pay | Admitting: Cardiology

## 2023-03-11 MED ORDER — AMLODIPINE BESYLATE 5 MG PO TABS: 5.0000 mg | ORAL_TABLET | Freq: Every day | ORAL | 3 refills | Status: DC

## 2023-03-11 NOTE — Telephone Encounter (Signed)
Patient states high BP since June 26  7/2 169/11 today 11:30 after meds 03/2167/102 7:00 am before meds 7/1  144/102 7am before meds 7/1  155/105 noon 6/30  140/90 7am 6/30 Not taken  6/28 167/114 7:30 am  6/28 7:30 pm 95/65 6/27  150/102  6:30 am  6/27  10:00 150/100 after medication  6/27108/71  2: 30  6/27 127/95  9:30 pm   Patient asymptomatic.  Losartan 100 mg Daily Follow up 7/22 1:30 pm.   Please advise

## 2023-03-11 NOTE — Telephone Encounter (Signed)
Spoke with pt wife, Aware of dr crenshaw's recommendations. New script sent to the pharmacy  

## 2023-03-11 NOTE — Telephone Encounter (Signed)
Pt c/o BP issue: STAT if pt c/o blurred vision, one-sided weakness or slurred speech  1. What are your last 5 BP readings? 169/111: 168/102:154/98  2. Are you having any other symptoms (ex. Dizziness, headache, blurred vision, passed out)? headaches  3. What is your BP issue? Running high

## 2023-03-12 MED ORDER — AMLODIPINE BESYLATE 2.5 MG PO TABS: 2.5000 mg | ORAL_TABLET | Freq: Every day | ORAL | 3 refills | Status: AC

## 2023-03-12 MED ORDER — LOSARTAN POTASSIUM 50 MG PO TABS: 50.0000 mg | ORAL_TABLET | Freq: Every day | ORAL | 3 refills | Status: DC

## 2023-03-12 NOTE — Telephone Encounter (Signed)
Pt c/o medication issue:  1. Name of Medication:   amLODipine (NORVASC) 5 MG tablet   2. How are you currently taking this medication (dosage and times per day)?   As prescribed  3. Are you having a reaction (difficulty breathing--STAT)?   4. What is your medication issue?    Wife stated patient took one tablet but his BP has dropped very low.  Wife wants to know how patient should be taking this medication and if his other medication could be causing the lower BP readings.

## 2023-03-12 NOTE — Telephone Encounter (Signed)
Called, spoke to pt's wife. She states pt took the new medication last night at 8pm. This morning his BP was 135/90 89/59 most recent 80/52 (2:16). Pt took a fluid pill from his father. Pt denies using the restroom more since taking the pill. Pt's wife states pt has not been taking Crestor. She states pt denies headache, SOB. Pt did have slight dizziness when trying to rake earlier, reports being tired. Pt retook blood pressure while on the phone:  88/53. Spoke with DOD (Dr. Allyson Sabal) he recommends pt decreasing Losartan to 50 mg daily and Amlodipine to 2.5 mg daily. Pt's wife verbalized understanding and will cut the current pills in half (Okay to do so per Iran Planas, pharmacist.) Pt will keep a blood pressure log including heart rate, and will see Dr. Jens Som 7/22 as scheduled. Medication list updated.

## 2023-03-12 NOTE — Addendum Note (Signed)
Addended by: Reynolds Bowl on: 03/12/2023 02:44 PM   Modules accepted: Orders

## 2023-03-17 ENCOUNTER — Other Ambulatory Visit: Payer: Self-pay

## 2023-03-17 MED ORDER — LOSARTAN POTASSIUM 50 MG PO TABS: 50.0000 mg | ORAL_TABLET | Freq: Every day | ORAL | 3 refills | Status: DC

## 2023-03-19 NOTE — Progress Notes (Signed)
HPI:  FU hypertension and coronary calcification. Stress echocardiogram March 2012 was normal. Upper ext arterial Dopplers April 2014 normal. CTA September 2015 showed no acute vascular abnormalities in upper extremities.  There was nonspecific narrowing of the left subclavian. Echocardiogram June 2022 showed normal LV function.  Carotid Dopplers June 2024 showed 1 to 39% bilateral stenosis.  Calcium score June 2024 28.4 which was 52nd percentile.  Since last seen, he denies dyspnea on exertion, orthopnea, PND, pedal edema.  He has had a mild chest pressure that is almost continuous.  He has had difficulties with labile blood pressure by report.  He is checking his blood pressure several times a day.  No syncope.    Current Outpatient Medications  Medication Sig Dispense Refill   aspirin EC 81 MG tablet Take 1 tablet (81 mg total) by mouth daily. Swallow whole. 90 tablet 3   levothyroxine (SYNTHROID) 150 MCG tablet Take 150 mcg by mouth daily before breakfast.     losartan (COZAAR) 50 MG tablet Take 1 tablet (50 mg total) by mouth daily. 90 tablet 3   omeprazole (PRILOSEC) 40 MG capsule Take 40 mg by mouth daily.   1   amLODipine (NORVASC) 2.5 MG tablet Take 1 tablet (2.5 mg total) by mouth daily. (Patient not taking: Reported on 03/31/2023) 180 tablet 3   rosuvastatin (CRESTOR) 40 MG tablet Take 1 tablet (40 mg total) by mouth daily. (Patient not taking: Reported on 03/31/2023) 90 tablet 3   No current facility-administered medications for this visit.     Past Medical History:  Diagnosis Date   Esophageal stricture    GERD (gastroesophageal reflux disease)    Hypertension    Hypothyroidism    Throat cancer (HCC)    Radiation    Past Surgical History:  Procedure Laterality Date   TONSILLECTOMY      Social History   Socioeconomic History   Marital status: Married    Spouse name: Not on file   Number of children: 4   Years of education: Not on file   Highest education level:  Not on file  Occupational History    Employer: [employer]    Comment: Geologist, engineering  Tobacco Use   Smoking status: Former   Smokeless tobacco: Never  Substance and Sexual Activity   Alcohol use: Yes    Comment: Rare   Drug use: Not on file   Sexual activity: Not on file  Other Topics Concern   Not on file  Social History Narrative   Not on file   Social Determinants of Health   Financial Resource Strain: Not on file  Food Insecurity: Not on file  Transportation Needs: Not on file  Physical Activity: Not on file  Stress: Not on file  Social Connections: Unknown (01/18/2022)   Received from [facility]   Social Network    Social Network: Not on file  Intimate Partner Violence: Unknown (12/10/2021)   Received from [facility]   HITS    Physically Hurt: Not on file    Insult or Talk Down To: Not on file    Threaten Physical Harm: Not on file    Scream or Curse: Not on file    Family History  Problem Relation Age of Onset   Heart disease Maternal Grandfather    Heart disease Maternal Grandmother     ROS: no fevers or chills, productive cough, hemoptysis, dysphasia, odynophagia, melena, hematochezia, dysuria, hematuria, rash, seizure activity, orthopnea, PND, pedal  edema, claudication. Remaining systems are negative.  Physical Exam: Well-developed well-nourished in no acute distress.  Skin is warm and dry.  HEENT is normal.  Neck is supple.  Chest is clear to auscultation with normal expansion.  Cardiovascular exam is regular rate and rhythm. 2/6 systolic murmur Abdominal exam nontender or distended. No masses palpated. Extremities show no edema. neuro grossly intact   A/P  1 coronary calcification-continue aspirin and statin.  I have personally reviewed the patient's previous calcium score.  The aortic valve calcium was added to his score incorrectly.  Total score is 28.4 which was 52nd percentile.  He is having occasional chest  pressure.  We will arrange a full coronary CTA to rule out obstructive coronary disease.  2 hypertension-blood pressure controlled.  However he is having dips at home.  Also times of elevated blood pressure.  We will treat with losartan 50 mg daily.  He will follow his blood pressure and we will advance as needed.  3 hyperlipidemia-add Crestor 40 mg daily.  Check lipids and liver in 8 weeks.  4 carotid artery disease-mild on most recent Dopplers.  Olga Millers, MD

## 2023-03-25 ENCOUNTER — Ambulatory Visit: Payer: 59 | Admitting: Cardiology

## 2023-03-31 ENCOUNTER — Ambulatory Visit: Payer: 59 | Admitting: Cardiology

## 2023-03-31 ENCOUNTER — Encounter: Payer: Self-pay | Admitting: Cardiology

## 2023-03-31 VITALS — BP 128/88 | HR 77 | Ht 72.0 in | Wt 186.2 lb

## 2023-03-31 DIAGNOSIS — R931 Abnormal findings on diagnostic imaging of heart and coronary circulation: Secondary | ICD-10-CM | POA: Diagnosis not present

## 2023-03-31 DIAGNOSIS — E785 Hyperlipidemia, unspecified: Secondary | ICD-10-CM

## 2023-03-31 DIAGNOSIS — I6523 Occlusion and stenosis of bilateral carotid arteries: Secondary | ICD-10-CM | POA: Diagnosis not present

## 2023-03-31 DIAGNOSIS — I10 Essential (primary) hypertension: Secondary | ICD-10-CM

## 2023-03-31 MED ORDER — LOSARTAN POTASSIUM 50 MG PO TABS: 50.0000 mg | ORAL_TABLET | Freq: Every day | ORAL | 3 refills | Status: DC

## 2023-03-31 MED ORDER — ROSUVASTATIN CALCIUM 40 MG PO TABS
40.0000 mg | ORAL_TABLET | Freq: Every day | ORAL | 3 refills | Status: AC
Start: 2023-03-31 — End: 2023-06-29

## 2023-03-31 MED ORDER — METOPROLOL TARTRATE 100 MG PO TABS: ORAL_TABLET | ORAL | 0 refills | Status: AC

## 2023-03-31 NOTE — Patient Instructions (Signed)
Medication Instructions:   START ROSUVASTATIN 40 MG ONCE DAILY  START LOSARTAN 50 MG ONCE DAILY  *If you need a refill on your cardiac medications before your next appointment, please call your pharmacy*   Lab Work:  Your physician recommends that you return for lab work in: [facility]  If you have labs (blood work) drawn today and your tests are completely normal, you will receive your results only by: MyChart Message (if you have MyChart) OR A paper copy in the mail If you have any lab test that is abnormal or we need to change your treatment, we will call you to review the results.   Testing/Procedures:    Your cardiac CT will be scheduled at   [facility] [address] [place], [place] [address] [phone]    If scheduled at [facility], please arrive at the [facility] and Children's Entrance (Entrance C2) of [facility] 30 minutes prior to test start time. You can use the FREE valet parking offered at entrance C (encouraged to control the heart rate for the test)  Proceed to the [facility] Radiology Department (first floor) to check-in and test prep.  All radiology patients and guests should use entrance C2 at [facility], accessed from [facility], even though the hospital's physical address listed is [address].     Please follow these instructions carefully (unless otherwise directed):  An IV will be required for this test and Nitroglycerin will be given.  Hold all erectile dysfunction medications at least 3 days (72 hrs) prior to test. (Ie viagra, cialis, sildenafil, tadalafil, etc)   On the Night Before the Test: Be sure to Drink plenty of water. Do not consume any caffeinated/decaffeinated beverages or chocolate 12 hours prior to your test. Do not take any antihistamines 12 hours prior to your test.   On the Day of the Test: Drink plenty of water until 1 hour prior to the test. Do not  eat any food 1 hour prior to test. You may take your regular medications prior to the test.  Take metoprolol (Lopressor) 100 MG two hours prior to test.      After the Test: Drink plenty of water. After receiving IV contrast, you may experience a mild flushed feeling. This is normal. On occasion, you may experience a mild rash up to 24 hours after the test. This is not dangerous. If this occurs, you can take Benadryl 25 mg and increase your fluid intake. If you experience trouble breathing, this can be serious. If it is severe call 911 IMMEDIATELY. If it is mild, please call our office.  We will call to schedule your test 2-4 weeks out understanding that some insurance companies will need an authorization prior to the service being performed.   For more information and frequently asked questions, please visit our website : [email or web address]  For non-scheduling related questions, please contact the cardiac imaging nurse navigator should you have any questions/concerns: Cardiac Imaging Nurse Navigators Direct Office Dial: [phone]   For scheduling needs, including cancellations and rescheduling, please call [place], [phone].    Follow-Up: At [facility], you and your health needs are our priority.  As part of our continuing mission to provide you with exceptional heart care, we have created designated Provider Care Teams.  These Care Teams include your primary Cardiologist (physician) and Advanced Practice Providers (APPs -  Physician Assistants and Nurse Practitioners) who all work together to provide you with  the care you need, when you need it.  We recommend signing up for the patient portal called "MyChart".  Sign up information is provided on this After Visit Summary.  MyChart is used to connect with patients for Virtual Visits (Telemedicine).  Patients are able to view lab/test results, encounter notes, upcoming appointments, etc.  Non-urgent messages  can be sent to your provider as well.   To learn more about what you can do with MyChart, go to [email or web address].    Your next appointment:   6 month(s)  Provider:   Olga Millers, MD

## 2023-04-01 ENCOUNTER — Other Ambulatory Visit: Payer: Self-pay | Admitting: Cardiology

## 2023-04-04 ENCOUNTER — Telehealth (HOSPITAL_COMMUNITY): Payer: Self-pay | Admitting: Emergency Medicine

## 2023-04-04 NOTE — Telephone Encounter (Signed)
Attempted to call patient regarding upcoming cardiac CT appointment. °Left message on voicemail with name and callback number °Sara Wallace RN Navigator Cardiac Imaging °[facility] °[phone] Office °[phone] Cell ° °

## 2023-04-07 ENCOUNTER — Ambulatory Visit (HOSPITAL_COMMUNITY)
Admission: RE | Admit: 2023-04-07 | Discharge: 2023-04-07 | Disposition: A | Discharge status: Home / Self Care | Payer: 59 | Source: Ambulatory Visit | Attending: Cardiology | Admitting: Cardiology

## 2023-04-07 DIAGNOSIS — R9430 Abnormal result of cardiovascular function study, unspecified: Secondary | ICD-10-CM | POA: Diagnosis not present

## 2023-04-07 DIAGNOSIS — R931 Abnormal findings on diagnostic imaging of heart and coronary circulation: Secondary | ICD-10-CM | POA: Diagnosis present

## 2023-04-07 MED ORDER — NITROGLYCERIN 0.4 MG SL SUBL
SUBLINGUAL_TABLET | SUBLINGUAL | Status: AC
  Filled 2023-04-07: qty 2

## 2023-04-07 MED ORDER — NITROGLYCERIN 0.4 MG SL SUBL
0.8000 mg | SUBLINGUAL_TABLET | SUBLINGUAL | Status: DC | PRN
  Administered 2023-04-07: 0.8 mg via SUBLINGUAL

## 2023-04-07 MED ORDER — IOHEXOL 350 MG/ML SOLN
100.0000 mL | Freq: Once | INTRAVENOUS | Status: AC | PRN
  Administered 2023-04-07: 100 mL via INTRAVENOUS

## 2023-07-01 ENCOUNTER — Encounter: Payer: Self-pay | Admitting: *Deleted

## 2024-03-30 ENCOUNTER — Other Ambulatory Visit: Payer: Self-pay

## 2024-03-30 MED ORDER — LOSARTAN POTASSIUM 50 MG PO TABS: 50.0000 mg | ORAL_TABLET | Freq: Every day | ORAL | 0 refills | Status: AC

## 2024-04-24 ENCOUNTER — Other Ambulatory Visit: Payer: Self-pay | Admitting: Cardiology
